# Patient Record
Sex: Female | Born: 1990
Health system: Southern US, Community
[De-identification: ages and names within clinical notes are randomized; demographics above are authoritative.]

## PROBLEM LIST (undated history)

## (undated) DIAGNOSIS — K219 Gastro-esophageal reflux disease without esophagitis: Secondary | ICD-10-CM

## (undated) DIAGNOSIS — F32A Depression, unspecified: Secondary | ICD-10-CM

## (undated) DIAGNOSIS — Z803 Family history of malignant neoplasm of breast: Secondary | ICD-10-CM

## (undated) DIAGNOSIS — F419 Anxiety disorder, unspecified: Secondary | ICD-10-CM

## (undated) DIAGNOSIS — F329 Major depressive disorder, single episode, unspecified: Secondary | ICD-10-CM

## (undated) DIAGNOSIS — Z789 Other specified health status: Secondary | ICD-10-CM

## (undated) HISTORY — DX: Other specified health status: Z78.9

## (undated) HISTORY — DX: Gastro-esophageal reflux disease without esophagitis: K21.9

## (undated) HISTORY — DX: Anxiety disorder, unspecified: F41.9

## (undated) HISTORY — PX: NO PAST SURGERIES: SHX2092

## (undated) HISTORY — DX: Depression, unspecified: F32.A

## (undated) HISTORY — DX: Major depressive disorder, single episode, unspecified: F32.9

## (undated) HISTORY — DX: Family history of malignant neoplasm of breast: Z80.3

---

## 2017-02-05 ENCOUNTER — Telehealth: Payer: Self-pay | Admitting: Obstetrics & Gynecology

## 2017-02-05 NOTE — Telephone Encounter (Signed)
[facility] referring for Pregnancy, Amenorrhea. lvm for pt to call back to be schedule

## 2017-02-08 ENCOUNTER — Encounter: Payer: Self-pay | Admitting: Obstetrics and Gynecology

## 2017-02-08 ENCOUNTER — Ambulatory Visit (INDEPENDENT_AMBULATORY_CARE_PROVIDER_SITE_OTHER): Payer: 59 | Admitting: Obstetrics and Gynecology

## 2017-02-08 VITALS — BP 102/62 | HR 95 | Ht 63.0 in | Wt 187.0 lb

## 2017-02-08 DIAGNOSIS — N912 Amenorrhea, unspecified: Secondary | ICD-10-CM

## 2017-02-08 DIAGNOSIS — Z3481 Encounter for supervision of other normal pregnancy, first trimester: Secondary | ICD-10-CM

## 2017-02-08 DIAGNOSIS — O219 Vomiting of pregnancy, unspecified: Secondary | ICD-10-CM

## 2017-02-08 LAB — POCT URINE PREGNANCY: Preg Test, Ur: POSITIVE — AB

## 2017-02-08 NOTE — Progress Notes (Signed)
02/09/2017   Chief Complaint: Missed period  Transfer of Care Patient: no  History of Present Illness: Ms. Yehuda MaoWillard is a 26 y.o. G2P1001 1929w4d based on Patient's last menstrual period was 01/01/2017 (exact date). with an Estimated Date of Delivery: 10/08/17, with the above CC.   Her periods were: regular periods every 28 days She was using no method when she conceived.  She has Positive signs or symptoms of nausea/vomiting of pregnancy. She has Negative signs or symptoms of miscarriage or preterm labor She identifies Negative Zika risk factors for her and her partner On any different medications around the time she conceived/early pregnancy: Yes, she was on an antidepressant, which she discontinued upon positive pregnancy test.  ROS: A 12-point review of systems was performed and negative, except as stated in the above HPI.  OBGYN History: As per HPI. OB History  Gravida Para Term Preterm AB Living  2 1 1     1   SAB TAB Ectopic Multiple Live Births          1    # Outcome Date GA Lbr Len/2nd Weight Sex Delivery Anes PTL Lv  2 Current           1 Term 11/19/11   8 lb 1 oz (3.657 kg)  Vag-Spont   LIV      Any issues with any prior pregnancies: no Any prior children are healthy, doing well, without any problems or issues: yes History of pap smears: No. Last pap smear: None.  History of STIs.   Past Medical History: Past Medical History:  Diagnosis Date  . No known health problems     Past Surgical History: Past Surgical History:  Procedure Laterality Date  . NO PAST SURGERIES      Family History:  Family History  Problem Relation Age of Onset  . Diabetes Maternal Grandmother   . Cancer Neg Hx    She denies any female cancers, bleeding or blood clotting disorders.  She denies any history of mental retardation, birth defects or genetic disorders in her or the FOB's history  Social History:  Social History   Socioeconomic History  . Marital status: Married   Spouse name: Not on file  . Number of children: Not on file  . Years of education: Not on file  . Highest education level: Not on file  Social Needs  . Financial resource strain: Not on file  . Food insecurity - worry: Not on file  . Food insecurity - inability: Not on file  . Transportation needs - medical: Not on file  . Transportation needs - non-medical: Not on file  Occupational History  . Not on file  Tobacco Use  . Smoking status: Former Games developermoker  . Smokeless tobacco: Never Used  . Tobacco comment: quit when found out pregnant  Substance and Sexual Activity  . Alcohol use: No    Frequency: Never  . Drug use: No  . Sexual activity: Yes    Birth control/protection: None  Other Topics Concern  . Not on file  Social History Narrative  . Not on file    Allergy: No Known Allergies  Current Outpatient Medications:  Current Outpatient Medications:  .  Prenatal Vit-Fe Fumarate-FA (MULTIVITAMIN-PRENATAL) 27-0.8 MG TABS tablet, Take 1 tablet daily at 12 noon by mouth., Disp: , Rfl:  .  Doxylamine-Pyridoxine ER (BONJESTA) 20-20 MG TBCR, Take 1 tablet 2 (two) times daily by mouth. Sig 1 tab po  daily at bedtime, 1 tab po daily morning,  Disp: 60 tablet, Rfl: 2 .  Prenatal Multivit-Min-Fe-FA (PRENATAL VITAMINS) 0.8 MG tablet, Take 1 tablet daily by mouth., Disp: 30 tablet, Rfl: 12   Physical Exam:   BP 102/62   Pulse 95   Ht 5\' 3"  (1.6 m)   Wt 187 lb (84.8 kg)   LMP 01/01/2017 (Exact Date)   BMI 33.13 kg/m  Body mass index is 33.13 kg/m. Constitutional: Well nourished, well developed female in no acute distress.  Neck:  Supple, normal appearance, and no thyromegaly  Cardiovascular: S1, S2 normal, no murmur, rub or gallop, regular rate and rhythm Respiratory:  Clear to auscultation bilateral. Normal respiratory effort Abdomen: positive bowel sounds and no masses, hernias; diffusely non tender to palpation, non distended Breasts: breasts appear normal, no suspicious masses,  no skin or nipple changes or axillary nodes. Neuro/Psych:  Normal mood and affect.  Skin:  Warm and dry.  Lymphatic:  No inguinal lymphadenopathy.   Pelvic exam: is not limited by body habitus EGBUS: within normal limits, Vagina: within normal limits and with no blood in the vault, Cervix: normal appearing cervix without discharge or lesions, closed/long/high, Uterus:  nonenlarged, and Adnexa:  normal adnexa and no mass, fullness, tenderness  Assessment: Ms. Yehuda MaoWillard is a 26 y.o. G2P1001 6459w4d based on Patient's last menstrual period was 01/01/2017 (exact date). with an Estimated Date of Delivery: 10/08/17,  for prenatal care.  Plan:  1) Avoid alcoholic beverages. 2) Patient encouraged not to smoke.  3) Discontinue the use of all non-medicinal drugs and chemicals.  4) Take prenatal vitamins daily.  5) Seatbelt use advised  6) Nutrition, food safety (fish, cheese advisories, and high nitrite foods) and exercise discussed. 7) Hospital and practice style delivering at [facility] discussed  8) Patient is asked about travel to areas at risk for the Zika virus, and counseled to avoid travel and exposure to mosquitoes or sexual partners who may have themselves been exposed to the virus. Testing is discussed, and will be ordered as appropriate.  9) [facility]birth classes at [facility] advised 10) Genetic Screening, such as with 1st Trimester Screening, cell free fetal DNA, AFP testing, and Ultrasound, as well as with amniocentesis and CVS as appropriate, is discussed with patient. She plans to She is considering if she wants genetic testing.  genetic testing this pregnancy. 11) Rx sent for prenatal vitamins and bonjesta  Problem list reviewed and updated.  Noreene Larssonhristanna Schuma, MD [facility], [facility] 02/09/2017  10:48 PM

## 2017-02-09 ENCOUNTER — Encounter: Payer: Self-pay | Admitting: Obstetrics and Gynecology

## 2017-02-09 DIAGNOSIS — O219 Vomiting of pregnancy, unspecified: Secondary | ICD-10-CM | POA: Insufficient documentation

## 2017-02-09 DIAGNOSIS — Z3481 Encounter for supervision of other normal pregnancy, first trimester: Secondary | ICD-10-CM | POA: Insufficient documentation

## 2017-02-09 LAB — RPR+RH+ABO+RUB AB+AB SCR+CB...
Antibody Screen: NEGATIVE
HEMATOCRIT: 43.5 % (ref 34.0–46.6)
HEMOGLOBIN: 14.3 g/dL (ref 11.1–15.9)
HEP B S AG: NEGATIVE
HIV Screen 4th Generation wRfx: NONREACTIVE
MCH: 29 pg (ref 26.6–33.0)
MCHC: 32.9 g/dL (ref 31.5–35.7)
MCV: 88 fL (ref 79–97)
Platelets: 291 10*3/uL (ref 150–379)
RBC: 4.93 x10E6/uL (ref 3.77–5.28)
RDW: 13.4 % (ref 12.3–15.4)
RH TYPE: POSITIVE
RPR Ser Ql: NONREACTIVE
Rubella Antibodies, IGG: 2.04 index (ref >0.99)
Varicella zoster IgG: 419 index (ref >165)
WBC: 7.8 10*3/uL (ref 3.4–10.8)

## 2017-02-09 MED ORDER — DOXYLAMINE-PYRIDOXINE ER 20-20 MG PO TBCR: 1.0000 | EXTENDED_RELEASE_TABLET | Freq: Two times a day (BID) | ORAL | 2 refills | Status: DC

## 2017-02-09 MED ORDER — PRENATAL VITAMINS 0.8 MG PO TABS: 1.0000 | ORAL_TABLET | Freq: Every day | ORAL | 12 refills | Status: DC

## 2017-02-12 ENCOUNTER — Telehealth: Payer: Self-pay

## 2017-02-12 ENCOUNTER — Other Ambulatory Visit: Payer: Self-pay | Admitting: Obstetrics and Gynecology

## 2017-02-12 ENCOUNTER — Encounter: Payer: Self-pay | Admitting: Obstetrics and Gynecology

## 2017-02-12 DIAGNOSIS — O219 Vomiting of pregnancy, unspecified: Secondary | ICD-10-CM

## 2017-02-12 LAB — PAP LB, CT-NG, RFX HPV ASCU
Chlamydia, Nuc. Acid Amp: NEGATIVE
Gonococcus, Nuc. Acid Amp: NEGATIVE
PAP SMEAR COMMENT: 0

## 2017-02-12 MED ORDER — DOXYLAMINE SUCCINATE (SLEEP) 25 MG PO TABS: 25.0000 mg | ORAL_TABLET | Freq: Four times a day (QID) | ORAL | 2 refills | Status: DC | PRN

## 2017-02-12 MED ORDER — PYRIDOXINE HCL 25 MG PO TABS
25.0000 mg | ORAL_TABLET | Freq: Four times a day (QID) | ORAL | 3 refills | Status: DC | PRN
Start: 2017-02-12 — End: 2017-04-07

## 2017-02-12 NOTE — Progress Notes (Signed)
I was mistaken. I just sent the patient a rx for B12 and unisom.

## 2017-02-12 NOTE — Telephone Encounter (Signed)
I was confused, but I spoke with this patient today on the phone and sent a rx for b12 and unisom to her pharmacy.

## 2017-02-12 NOTE — Telephone Encounter (Signed)
Okay just wanted to make sure I didn't need to do anything else. Thank you for letting me know!

## 2017-02-12 NOTE — Telephone Encounter (Signed)
Pt's pharmacy sent second fax request for prior authorization or medication change of bonjesta. Please send rx for diclegis to see if it is covered before I begin process of prior auth for bonjesta. Thank you!

## 2017-02-12 NOTE — Telephone Encounter (Signed)
Discussed with the patient yesterday and she does not desire  a prescription for bonjesta or diclegis. Thank you!

## 2017-02-15 LAB — URINE CULTURE

## 2017-02-15 LAB — TOXASSURE SELECT 13 (MW), URINE

## 2017-02-17 ENCOUNTER — Telehealth: Payer: 59 | Admitting: Family

## 2017-02-17 DIAGNOSIS — O219 Vomiting of pregnancy, unspecified: Secondary | ICD-10-CM

## 2017-02-17 NOTE — Progress Notes (Signed)
Based on what you shared with me it looks like you have a serious condition that should be evaluated in a face to face office visit.  NOTE: Even if you have entered your credit card information for this eVisit, you will not be charged.   You need to follow up with your GYN. Please try to stay hydrated and drink lots of fluids.   If you are having a true medical emergency please call 911.  If you need an urgent face to face visit,  has four urgent care centers for your convenience.  If you need care fast and have a high deductible or no insurance consider:   [email or web address]  [phone]  [address], [address] [place], [place] [address] 8 am to 8 pm Monday-Friday 10 am to 4 pm Saturday-Sunday   The following sites will take your  insurance:    . [facility]  [phone] Get Driving Directions Find a Provider at this Location  [address] [place], [place] [address] . 10 am to 8 pm Monday-Friday . 12 pm to 8 pm Saturday-Sunday   . [facility]  [phone] Get Driving Directions Find a Provider at this Location  [address] [place], [place] [address] . 8 am to 8 pm Monday-Friday . 9 am to 6 pm Saturday . 11 am to 6 pm Sunday   . [facility] at [facility] [place]  [phone] Get Driving Directions  [address] [place], [place] [address] . 8 am to 8 pm Monday-Friday . 8 am to 4 pm Saturday-Sunday   Your e-visit answers were reviewed by a board certified advanced clinical practitioner to complete your personal care plan.  Thank you for using e-Visits.

## 2017-02-19 ENCOUNTER — Telehealth: Payer: Self-pay

## 2017-02-19 ENCOUNTER — Other Ambulatory Visit: Payer: Self-pay | Admitting: Obstetrics and Gynecology

## 2017-02-19 MED ORDER — ONDANSETRON 4 MG PO TBDP: 4.0000 mg | ORAL_TABLET | Freq: Four times a day (QID) | ORAL | 0 refills | Status: DC | PRN

## 2017-02-19 MED ORDER — DOCUSATE SODIUM 100 MG PO CAPS: 100.0000 mg | ORAL_CAPSULE | Freq: Two times a day (BID) | ORAL | 2 refills | Status: DC | PRN

## 2017-02-19 NOTE — Progress Notes (Unsigned)
zofran

## 2017-02-19 NOTE — Telephone Encounter (Signed)
I returned the patients call. Sent in rx for zofran and colace.

## 2017-02-19 NOTE — Telephone Encounter (Signed)
Pt given promethazine rectal suppositories but they cause drowsiness and she does not want to take it during the day. Pt has tried vitamin B6 and unisom but unable to keep it down. Pt unable to keep fluids, meds or food down over the weekend and feeling bad again today. Please advise. Thank you.

## 2017-02-19 NOTE — Telephone Encounter (Signed)
Pt was seen in ER on Sunday for dehydration. She hadn't been able to keep anything down for 3 days prior. She was given fluids & nausea meds which helped, but n/v has returned today. She is trying to avoid having to return to the ER & is inquiring if there is anything else she can take for nausea as she is unable to keep the pills down. IO#[phone]#[phone].

## 2017-02-22 ENCOUNTER — Ambulatory Visit (INDEPENDENT_AMBULATORY_CARE_PROVIDER_SITE_OTHER): Payer: 59

## 2017-02-22 ENCOUNTER — Ambulatory Visit (INDEPENDENT_AMBULATORY_CARE_PROVIDER_SITE_OTHER): Payer: 59 | Admitting: Obstetrics & Gynecology

## 2017-02-22 VITALS — BP 100/70 | Wt 183.0 lb

## 2017-02-22 DIAGNOSIS — Z3481 Encounter for supervision of other normal pregnancy, first trimester: Secondary | ICD-10-CM

## 2017-02-22 DIAGNOSIS — Z363 Encounter for antenatal screening for malformations: Secondary | ICD-10-CM | POA: Diagnosis not present

## 2017-02-22 DIAGNOSIS — O219 Vomiting of pregnancy, unspecified: Secondary | ICD-10-CM

## 2017-02-22 DIAGNOSIS — Z3A01 Less than 8 weeks gestation of pregnancy: Secondary | ICD-10-CM

## 2017-02-22 MED ORDER — PROMETHAZINE HCL 25 MG RE SUPP: 25.0000 mg | Freq: Four times a day (QID) | RECTAL | 0 refills | Status: DC | PRN

## 2017-02-22 MED ORDER — METOCLOPRAMIDE HCL 10 MG PO TABS
10.0000 mg | ORAL_TABLET | Freq: Three times a day (TID) | ORAL | 3 refills | Status: DC
Start: 2017-02-22 — End: 2017-06-14

## 2017-02-22 NOTE — Progress Notes (Signed)
Review of ULTRASOUND.    I have personally reviewed images and report of [facility].    Plan of management to be discussed with patient.   Nausea discussed.  Reglan and B6 TID, Phenergan supp when severe.    Has had 2 hospital visits for IVF.  Will cont to monitor closely.  Monitor for vag bleeding or pain.  Declines genetic testing  Annamarie MajorPaul Tamar Lipscomb, MD, Merlinda FrederickFACOG [facility], [facility] 02/22/2017  5:13 PM

## 2017-02-23 DIAGNOSIS — Z803 Family history of malignant neoplasm of breast: Secondary | ICD-10-CM

## 2017-02-23 HISTORY — DX: Family history of malignant neoplasm of breast: Z80.3

## 2017-03-01 ENCOUNTER — Other Ambulatory Visit: Payer: Self-pay | Admitting: Obstetrics and Gynecology

## 2017-03-12 ENCOUNTER — Encounter: Payer: Self-pay | Admitting: Obstetrics and Gynecology

## 2017-03-24 ENCOUNTER — Other Ambulatory Visit: Payer: Self-pay | Admitting: Obstetrics & Gynecology

## 2017-03-25 ENCOUNTER — Encounter: Payer: 59 | Admitting: Obstetrics and Gynecology

## 2017-04-01 ENCOUNTER — Ambulatory Visit (INDEPENDENT_AMBULATORY_CARE_PROVIDER_SITE_OTHER): Payer: 59 | Admitting: Maternal Newborn

## 2017-04-01 ENCOUNTER — Encounter: Payer: Self-pay | Admitting: Maternal Newborn

## 2017-04-01 VITALS — BP 102/62 | Wt 178.0 lb

## 2017-04-01 DIAGNOSIS — Z3481 Encounter for supervision of other normal pregnancy, first trimester: Secondary | ICD-10-CM

## 2017-04-01 NOTE — Progress Notes (Signed)
n

## 2017-04-01 NOTE — Progress Notes (Signed)
Routine Prenatal Care Visit  Subjective  Rhonda Sweeney is a 27 y.o. G2P1001 at [redacted]w[redacted]d being seen today for ongoing prenatal care.  She is currently monitored for the following issues for this low-risk pregnancy and has Supervision of normal intrauterine pregnancy in multigravida in first trimester and Nausea and vomiting during pregnancy on their problem list.  ----------------------------------------------------------------------------------- Patient reports nausea and vomiting.  It occurs about every other day and is worse in the afternoon and evening. She is able to keep food and liquids down in the mornings. It has improved in the last couple of weeks. She also has some lower uterine pains occasionally when changing positions. Contractions: Not present. Vag. Bleeding: None.  Denies leaking of fluid.  ----------------------------------------------------------------------------------- The following portions of the patient's history were reviewed and updated as appropriate: allergies, current medications, past family history, past medical history, past social history, past surgical history and problem list. Problem list updated.   Objective  Last menstrual period 01/01/2017. Pregravid weight 187 lb (84.8 kg) Total Weight Gain  (-4.082 kg) Urinalysis: Urine Protein: Negative Urine Glucose: Negative  Fetal Status: Fetal Heart Rate (bpm): 144         General:  Alert, oriented and cooperative. Patient is in no acute distress.  Skin: Skin is warm and dry. No rash noted.   Cardiovascular: Normal heart rate noted  Respiratory: Normal respiratory effort, no problems with respiration noted  Abdomen: Soft, gravid, appropriate for gestational age.       Pelvic:  Cervical exam deferred        Extremities: Normal range of motion.  Edema: None  Mental Status: Normal mood and affect. Normal behavior. Normal judgment and thought content.     Assessment   26 y.o. G2P1001 at [redacted]w[redacted]d, EDD 10/08/2017  by Last Menstrual Period presenting for routine prenatal visit.  Plan   second pregnancy Problems (from 02/08/17 to present)    Problem Noted Resolved   Supervision of normal intrauterine pregnancy in multigravida in first trimester 02/09/2017 by Natale Milch, MD No   Overview Signed 02/09/2017 10:56 PM by Natale Milch, MD      Clinic [facility] Prenatal Labs  Dating  Blood type: B/Positive/-- (11/16 1020)   Genetic Screen 1 Screen:     AFP:      Quad:      NIPS:    Antibody:Negative (11/16 1020)  Anatomic [place]  Rubella: 2.04 (11/16 1020) Varicella: @VZVIGG @  GTT Early:        28 wk:      RPR: Non Reactive (11/16 1020)   Rhogam  HBsAg: Negative (11/16 1020)   TDaP vaccine                       HIV:     Flu Shot                                GBS:   Contraception  Pap:  CBB     CS/VBAC    Baby Food    Support Person             Nausea and vomiting during pregnancy 02/09/2017 by Natale Milch, MD No      Preterm labor symptoms and general obstetric precautions including but not limited to vaginal bleeding, contractions, leaking of fluid and fetal movement were reviewed in detail with the patient. Please refer to After Visit Summary for  other counseling recommendations.   Return in about 4 weeks (around 04/29/2017) for ROB.  Marcelyn BruinsJacelyn Schmid, CNM 04/01/2017  8:40 AM

## 2017-04-01 NOTE — Progress Notes (Signed)
Pt c/o nausea/ vomiting

## 2017-04-01 NOTE — Patient Instructions (Signed)

## 2017-04-07 ENCOUNTER — Other Ambulatory Visit: Payer: Self-pay | Admitting: Obstetrics and Gynecology

## 2017-04-07 DIAGNOSIS — O219 Vomiting of pregnancy, unspecified: Secondary | ICD-10-CM

## 2017-04-08 ENCOUNTER — Encounter: Payer: Self-pay | Admitting: Obstetrics and Gynecology

## 2017-04-08 ENCOUNTER — Other Ambulatory Visit: Payer: Self-pay | Admitting: Obstetrics and Gynecology

## 2017-04-08 DIAGNOSIS — O219 Vomiting of pregnancy, unspecified: Secondary | ICD-10-CM

## 2017-04-08 MED ORDER — ONDANSETRON 4 MG PO TBDP: 4.0000 mg | ORAL_TABLET | Freq: Four times a day (QID) | ORAL | 4 refills | Status: DC | PRN

## 2017-04-08 NOTE — Telephone Encounter (Signed)
Called and discussed with patient. Sent prescription for zofran. Recommended applesauce, popsicles, Gatorade, rice, toast, bananas.

## 2017-04-11 ENCOUNTER — Other Ambulatory Visit: Payer: Self-pay | Admitting: Obstetrics & Gynecology

## 2017-04-26 NOTE — Telephone Encounter (Signed)
Please advise for refills. Thank you! 

## 2017-04-29 ENCOUNTER — Encounter: Payer: 59 | Admitting: Obstetrics and Gynecology

## 2017-05-03 ENCOUNTER — Ambulatory Visit (INDEPENDENT_AMBULATORY_CARE_PROVIDER_SITE_OTHER): Payer: 59 | Admitting: Obstetrics & Gynecology

## 2017-05-03 VITALS — BP 100/60 | Wt 178.0 lb

## 2017-05-03 DIAGNOSIS — Z3481 Encounter for supervision of other normal pregnancy, first trimester: Secondary | ICD-10-CM

## 2017-05-03 DIAGNOSIS — Z3A17 17 weeks gestation of pregnancy: Secondary | ICD-10-CM

## 2017-05-03 NOTE — Progress Notes (Signed)
  Subjective  Fetal Movement? no Contractions? no Leaking Fluid? no Vaginal Bleeding? no  Objective  BP 100/60   Wt 178 lb (80.7 kg)   LMP 01/01/2017 (Exact Date)   BMI 31.53 kg/m  General: NAD Pumonary: no increased work of breathing Abdomen: gravid, non-tender Extremities: no edema Psychiatric: mood appropriate, affect full  Assessment  26 y.o. G2P1001 at 6144w3d by  10/08/2017, by Last Menstrual Period presenting for routine prenatal visit  Plan   Problem List Items Addressed This Visit      Other   Supervision of normal intrauterine pregnancy in multigravida in first trimester   Relevant Orders   US OB Comp + 14 Wk    Other Visit Diagnoses    [redacted] weeks gestation of pregnancy    -  Primary   Relevant Orders   US OB Comp + 14 Wk    PNV  Annamarie MajorPaul Shantee Hayne, MD, FACOG [facility], [facility] 05/03/2017  4:59 PM

## 2017-05-03 NOTE — Patient Instructions (Signed)

## 2017-05-14 ENCOUNTER — Telehealth: Payer: Self-pay

## 2017-05-14 NOTE — Telephone Encounter (Signed)
Pt reports she is [redacted] wks pregnant & starting this a.m. She has been feeling some pressure in her chest causing some SOB. She states when touching the area where pressure is it hurts. Pt inquiring if normal. Cb#[phone]

## 2017-05-14 NOTE — Telephone Encounter (Signed)
Spoke w/pt. Pt states her pain & SOB has decreased w/Tylenol. Chest still feels a little sore to touch. Advised can try Tums/Zantac to r/o Indigestion which can cause chest pain. Pt denies any calf pain etc that would relate to DVT. Pt will try Tums & notify us if s&s worsen or do not improve and she desires apt for eval.

## 2017-05-16 ENCOUNTER — Other Ambulatory Visit: Payer: Self-pay | Admitting: Obstetrics and Gynecology

## 2017-05-16 ENCOUNTER — Other Ambulatory Visit: Payer: Self-pay | Admitting: Obstetrics & Gynecology

## 2017-05-16 DIAGNOSIS — O219 Vomiting of pregnancy, unspecified: Secondary | ICD-10-CM

## 2017-05-16 NOTE — Telephone Encounter (Signed)
Pt has had 5 dispenses of this medication in the past 2 months. Please advise for refill. Thank you.

## 2017-05-16 NOTE — Telephone Encounter (Signed)
Please advise 

## 2017-05-17 ENCOUNTER — Ambulatory Visit (INDEPENDENT_AMBULATORY_CARE_PROVIDER_SITE_OTHER): Payer: BLUE CROSS/BLUE SHIELD | Admitting: Maternal Newborn

## 2017-05-17 ENCOUNTER — Encounter: Payer: Self-pay | Admitting: Maternal Newborn

## 2017-05-17 ENCOUNTER — Ambulatory Visit (INDEPENDENT_AMBULATORY_CARE_PROVIDER_SITE_OTHER): Payer: BLUE CROSS/BLUE SHIELD

## 2017-05-17 VITALS — BP 100/60 | Wt 176.0 lb

## 2017-05-17 DIAGNOSIS — Z3481 Encounter for supervision of other normal pregnancy, first trimester: Secondary | ICD-10-CM

## 2017-05-17 DIAGNOSIS — Z3689 Encounter for other specified antenatal screening: Secondary | ICD-10-CM

## 2017-05-17 DIAGNOSIS — Z3A17 17 weeks gestation of pregnancy: Secondary | ICD-10-CM | POA: Diagnosis not present

## 2017-05-17 DIAGNOSIS — Z3A19 19 weeks gestation of pregnancy: Secondary | ICD-10-CM

## 2017-05-17 NOTE — Progress Notes (Signed)
    Routine Prenatal Care Visit  Subjective  Rhonda Sweeney is a 27 y.o. G2P1001 at 4464w3d being seen today for ongoing prenatal care.  She is currently monitored for the following issues for this low-risk pregnancy and has Supervision of normal intrauterine pregnancy in multigravida in first trimester and Nausea and vomiting during pregnancy on their problem list.  ----------------------------------------------------------------------------------- Patient reports nausea, vomiting, heartburn. Contractions: Not present. Vag. Bleeding: None.  Movement: Present. Denies leaking of fluid.  ----------------------------------------------------------------------------------- The following portions of the patient's history were reviewed and updated as appropriate: allergies, current medications, past family history, past medical history, past social history, past surgical history and problem list. Problem list updated.   Objective  Last menstrual period 01/01/2017. Pregravid weight 187 lb (84.8 kg) Total Weight Gain  (-4.99 kg) Urinalysis: Urine Protein: Negative Urine Glucose: Negative  Fetal Status: Fetal Heart Rate (bpm): 147   Movement: Present     General:  Alert, oriented and cooperative. Patient is in no acute distress.  Skin: Skin is warm and dry. No rash noted.   Cardiovascular: Normal heart rate noted  Respiratory: Normal respiratory effort, no problems with respiration noted  Abdomen: Soft, gravid, appropriate for gestational age. Pain/Pressure: Absent     Pelvic:  Cervical exam deferred        Extremities: Normal range of motion.     Mental Status: Normal mood and affect. Normal behavior. Normal judgment and thought content.     Assessment   26 y.o. G2P1001 at 464w3d, EDD 10/08/2017 by Last Menstrual Period presenting for routine prenatal visit.  Plan   second pregnancy Problems (from 02/08/17 to present)    Problem Noted Resolved   Supervision of normal intrauterine pregnancy  in multigravida in first trimester 02/09/2017 by Natale MilchSchuman, Christanna R, MD No   Overview Signed 02/09/2017 10:56 PM by Natale MilchSchuman, Christanna R, MD      Clinic [facility] Prenatal Labs  Dating  Blood type: B/Positive/-- (11/16 1020)   Genetic Screen 1 Screen:     AFP:      Quad:      NIPS:    Antibody:Negative (11/16 1020)  Anatomic US  Rubella: 2.04 (11/16 1020) Varicella: @VZVIGG @  GTT Early:        28 wk:      RPR: Non Reactive (11/16 1020)   Rhogam  HBsAg: Negative (11/16 1020)   TDaP vaccine                       HIV:     Flu Shot                                GBS:   Contraception  Pap:  CBB     CS/VBAC    Baby Food    Support Person             Nausea and vomiting during pregnancy 02/09/2017 by Natale MilchSchuman, Christanna R, MD No    Anatomy scan incomplete for spine and face, otherwise normal. Follow up in 4 weeks.  Continue antiemetics and medication for heartburn and advised dietary measures for nausea control.  Preterm labor symptoms and general obstetric precautions were reviewed with the patient.  Return in about 4 weeks (around 06/14/2017) for ROB and anatomy f/u ultrasound.  Marcelyn BruinsJacelyn Schmid, CNM 05/23/2017  12:15 PM

## 2017-05-17 NOTE — Progress Notes (Signed)
C/o been really sick.rj

## 2017-05-24 ENCOUNTER — Encounter: Payer: Self-pay | Admitting: Obstetrics and Gynecology

## 2017-05-29 ENCOUNTER — Other Ambulatory Visit: Payer: Self-pay | Admitting: Obstetrics & Gynecology

## 2017-05-29 NOTE — Telephone Encounter (Signed)
Please advise 

## 2017-06-14 ENCOUNTER — Ambulatory Visit (INDEPENDENT_AMBULATORY_CARE_PROVIDER_SITE_OTHER): Payer: BLUE CROSS/BLUE SHIELD | Admitting: Obstetrics and Gynecology

## 2017-06-14 ENCOUNTER — Ambulatory Visit (INDEPENDENT_AMBULATORY_CARE_PROVIDER_SITE_OTHER): Payer: BLUE CROSS/BLUE SHIELD

## 2017-06-14 VITALS — BP 120/70 | Wt 186.0 lb

## 2017-06-14 DIAGNOSIS — Z3A24 24 weeks gestation of pregnancy: Secondary | ICD-10-CM | POA: Diagnosis not present

## 2017-06-14 DIAGNOSIS — Z3689 Encounter for other specified antenatal screening: Secondary | ICD-10-CM

## 2017-06-14 DIAGNOSIS — Z113 Encounter for screening for infections with a predominantly sexual mode of transmission: Secondary | ICD-10-CM

## 2017-06-14 DIAGNOSIS — Z3A23 23 weeks gestation of pregnancy: Secondary | ICD-10-CM

## 2017-06-14 DIAGNOSIS — Z3481 Encounter for supervision of other normal pregnancy, first trimester: Secondary | ICD-10-CM

## 2017-06-14 DIAGNOSIS — Z131 Encounter for screening for diabetes mellitus: Secondary | ICD-10-CM

## 2017-06-14 NOTE — Progress Notes (Signed)
ROB Anatomy scan follow up today 

## 2017-06-14 NOTE — Progress Notes (Signed)
Routine Prenatal Care Visit  Subjective  Rhonda Sweeney is a 27 y.o. G2P1001 at [redacted]w[redacted]d being seen today for ongoing prenatal care.  She is currently monitored for the following issues for this low-risk pregnancy and has Supervision of normal intrauterine pregnancy in multigravida in first trimester and Nausea and vomiting during pregnancy on their problem list.  ----------------------------------------------------------------------------------- Patient reports no complaints.   Contractions: Not present. Vag. Bleeding: None.  Movement: Present. Denies leaking of fluid.  ----------------------------------------------------------------------------------- The following portions of the patient's history were reviewed and updated as appropriate: allergies, current medications, past family history, past medical history, past social history, past surgical history and problem list. Problem list updated.   Objective  Blood pressure 120/70, weight 186 lb (84.4 kg), last menstrual period 01/01/2017. Pregravid weight 187 lb (84.8 kg) Total Weight Gain -1 lb (-0.454 kg) Urinalysis:      Fetal Status: Fetal Heart Rate (bpm): 145   Movement: Present     General:  Alert, oriented and cooperative. Patient is in no acute distress.  Skin: Skin is warm and dry. No rash noted.   Cardiovascular: Normal heart rate noted  Respiratory: Normal respiratory effort, no problems with respiration noted  Abdomen: Soft, gravid, appropriate for gestational age. Pain/Pressure: Absent     Pelvic:  Cervical exam deferred        Extremities: Normal range of motion.     ental Status: Normal mood and affect. Normal behavior. Normal judgment and thought content.   US Ob Follow Up  Result Date: 06/14/2017 ULTRASOUND REPORT Location: [facility] Date of Service: 06/14/2017 Indications:F/U Anatomy Findings: Mason Jim intrauterine pregnancy is visualized with FHR at 148 BPM. Biometrics give an (U/S) Gestational age of [redacted]w[redacted]d  and an (U/S) EDD of 10/08/2017; this correlates with the clinically established EDD of Estimated Date of Delivery: 10/08/17. Fetal presentation is Transverse. Placenta: Anterior, Grade 0. AFI: subjectively normal. Anatomic survey is complete. Survey of the adnexa demonstrates no adnexal masses. There is no free peritoneal fluid in the cul de sac. Impression: 1. [redacted]w[redacted]d Viable Singleton Intrauterine pregnancy previously established criteria. 2. Normal Anatomy Scan is now complete Recommendations: 1.Clinical correlation with the patient's History and Physical Exam. Mital bahen P Patel, RDMS There is a singleton gestation with subjectively normal amniotic fluid volume.  Limited evaluation of the fetal anatomy was performed today, focusing on on anatomic structures not fully visualized at the time of prior study.The visualized fetal anatomical survey appears within normal limits within the resolution of ultrasound as described above, and the anatomic survey is now complete.  It must be noted that a normal ultrasound is unable to rule out fetal aneuploidy.  Vena Austria, MD, Evern Core [facility], [facility] 06/14/2017, 4:25 PM     Assessment   26 y.o. G2P1001 at [redacted]w[redacted]d by  10/08/2017, by Last Menstrual Period presenting for routine prenatal visit  Plan   second pregnancy Problems (from 02/08/17 to present)    Problem Noted Resolved   Supervision of normal intrauterine pregnancy in multigravida in first trimester 02/09/2017 by Natale Milch, MD No   Overview Addendum 05/23/2017 12:16 PM by Oswaldo Conroy, CNM      Clinic [facility] Prenatal Labs  Dating  Blood type: B/Positive/-- (11/16 1020)   Genetic Screen Declined Antibody:Negative (11/16 1020)  Anatomic [place]  Rubella: 2.04 (11/16 1020) Varicella: Immune  GTT Early:        28 wk:      RPR: Non Reactive (11/16 1020)   Rhogam  HBsAg: Negative (11/16 1020)   TDaP vaccine                       HIV: Non Reactive (11/16 1020)     Flu Shot                                GBS:   Contraception  Pap:  CBB     CS/VBAC    Baby Food    Support Person             Nausea and vomiting during pregnancy 02/09/2017 by Natale MilchSchuman, Christanna R, MD No       Gestational age appropriate obstetric precautions including but not limited to vaginal bleeding, contractions, leaking of fluid and fetal movement were reviewed in detail with the patient.    Return in about 1 month (around 07/12/2017) for ROB and 28 week labs.  Vena AustriaAndreas Deaven Barron, MD, Evern CoreFACOG [facility], [facility] 06/16/2017, 10:15 PM     Fusion plus samples given to take with prenatal gummies or providia ob (also sampled)  Anatomy scan complete  28 week labs next visit

## 2017-06-28 DIAGNOSIS — Z3A25 25 weeks gestation of pregnancy: Secondary | ICD-10-CM | POA: Diagnosis not present

## 2017-06-28 DIAGNOSIS — O4702 False labor before 37 completed weeks of gestation, second trimester: Secondary | ICD-10-CM | POA: Diagnosis not present

## 2017-06-28 DIAGNOSIS — Z87891 Personal history of nicotine dependence: Secondary | ICD-10-CM | POA: Diagnosis not present

## 2017-07-11 ENCOUNTER — Encounter: Payer: BLUE CROSS/BLUE SHIELD | Admitting: Advanced Practice Midwife

## 2017-07-11 ENCOUNTER — Other Ambulatory Visit: Payer: BLUE CROSS/BLUE SHIELD

## 2017-08-05 DIAGNOSIS — Z3A30 30 weeks gestation of pregnancy: Secondary | ICD-10-CM | POA: Diagnosis not present

## 2017-08-05 DIAGNOSIS — O99213 Obesity complicating pregnancy, third trimester: Secondary | ICD-10-CM | POA: Diagnosis not present

## 2017-08-08 DIAGNOSIS — R7302 Impaired glucose tolerance (oral): Secondary | ICD-10-CM | POA: Diagnosis not present

## 2017-08-22 DIAGNOSIS — F419 Anxiety disorder, unspecified: Secondary | ICD-10-CM | POA: Diagnosis not present

## 2017-08-22 DIAGNOSIS — O9989 Other specified diseases and conditions complicating pregnancy, childbirth and the puerperium: Secondary | ICD-10-CM | POA: Diagnosis not present

## 2017-08-22 DIAGNOSIS — M545 Low back pain: Secondary | ICD-10-CM | POA: Diagnosis not present

## 2017-08-22 DIAGNOSIS — O99213 Obesity complicating pregnancy, third trimester: Secondary | ICD-10-CM | POA: Diagnosis not present

## 2017-08-22 DIAGNOSIS — N760 Acute vaginitis: Secondary | ICD-10-CM | POA: Diagnosis not present

## 2017-08-22 DIAGNOSIS — Z041 Encounter for examination and observation following transport accident: Secondary | ICD-10-CM | POA: Diagnosis not present

## 2017-08-22 DIAGNOSIS — Z3A33 33 weeks gestation of pregnancy: Secondary | ICD-10-CM | POA: Diagnosis not present

## 2017-08-22 DIAGNOSIS — O23593 Infection of other part of genital tract in pregnancy, third trimester: Secondary | ICD-10-CM | POA: Diagnosis not present

## 2017-08-22 DIAGNOSIS — O99343 Other mental disorders complicating pregnancy, third trimester: Secondary | ICD-10-CM | POA: Diagnosis not present

## 2017-08-22 DIAGNOSIS — Z87891 Personal history of nicotine dependence: Secondary | ICD-10-CM | POA: Diagnosis not present

## 2017-08-22 DIAGNOSIS — R198 Other specified symptoms and signs involving the digestive system and abdomen: Secondary | ICD-10-CM | POA: Diagnosis not present

## 2017-08-22 DIAGNOSIS — E669 Obesity, unspecified: Secondary | ICD-10-CM | POA: Diagnosis not present

## 2017-08-22 DIAGNOSIS — O9A213 Injury, poisoning and certain other consequences of external causes complicating pregnancy, third trimester: Secondary | ICD-10-CM | POA: Diagnosis not present

## 2017-08-22 DIAGNOSIS — O99810 Abnormal glucose complicating pregnancy: Secondary | ICD-10-CM | POA: Diagnosis not present

## 2017-08-23 DIAGNOSIS — O9A213 Injury, poisoning and certain other consequences of external causes complicating pregnancy, third trimester: Secondary | ICD-10-CM | POA: Diagnosis not present

## 2017-08-23 DIAGNOSIS — O99343 Other mental disorders complicating pregnancy, third trimester: Secondary | ICD-10-CM | POA: Diagnosis not present

## 2017-08-23 DIAGNOSIS — O9989 Other specified diseases and conditions complicating pregnancy, childbirth and the puerperium: Secondary | ICD-10-CM | POA: Diagnosis not present

## 2017-08-23 DIAGNOSIS — N760 Acute vaginitis: Secondary | ICD-10-CM | POA: Diagnosis not present

## 2017-08-23 DIAGNOSIS — R198 Other specified symptoms and signs involving the digestive system and abdomen: Secondary | ICD-10-CM | POA: Diagnosis not present

## 2017-08-23 DIAGNOSIS — Z041 Encounter for examination and observation following transport accident: Secondary | ICD-10-CM | POA: Diagnosis not present

## 2017-08-23 DIAGNOSIS — E669 Obesity, unspecified: Secondary | ICD-10-CM | POA: Diagnosis not present

## 2017-08-23 DIAGNOSIS — O99213 Obesity complicating pregnancy, third trimester: Secondary | ICD-10-CM | POA: Diagnosis not present

## 2017-08-23 DIAGNOSIS — Z3A33 33 weeks gestation of pregnancy: Secondary | ICD-10-CM | POA: Diagnosis not present

## 2017-08-23 DIAGNOSIS — M545 Low back pain: Secondary | ICD-10-CM | POA: Diagnosis not present

## 2017-08-23 DIAGNOSIS — O23593 Infection of other part of genital tract in pregnancy, third trimester: Secondary | ICD-10-CM | POA: Diagnosis not present

## 2017-08-23 DIAGNOSIS — Z87891 Personal history of nicotine dependence: Secondary | ICD-10-CM | POA: Diagnosis not present

## 2017-08-23 DIAGNOSIS — F419 Anxiety disorder, unspecified: Secondary | ICD-10-CM | POA: Diagnosis not present

## 2017-09-02 DIAGNOSIS — Z3482 Encounter for supervision of other normal pregnancy, second trimester: Secondary | ICD-10-CM | POA: Diagnosis not present

## 2017-09-17 DIAGNOSIS — R12 Heartburn: Secondary | ICD-10-CM | POA: Diagnosis not present

## 2017-09-17 DIAGNOSIS — O9989 Other specified diseases and conditions complicating pregnancy, childbirth and the puerperium: Secondary | ICD-10-CM | POA: Diagnosis not present

## 2017-09-17 DIAGNOSIS — Z3A36 36 weeks gestation of pregnancy: Secondary | ICD-10-CM | POA: Diagnosis not present

## 2017-09-17 DIAGNOSIS — R262 Difficulty in walking, not elsewhere classified: Secondary | ICD-10-CM | POA: Diagnosis not present

## 2017-09-17 DIAGNOSIS — O99353 Diseases of the nervous system complicating pregnancy, third trimester: Secondary | ICD-10-CM | POA: Diagnosis not present

## 2017-09-17 DIAGNOSIS — G479 Sleep disorder, unspecified: Secondary | ICD-10-CM | POA: Diagnosis not present

## 2017-09-17 DIAGNOSIS — R102 Pelvic and perineal pain: Secondary | ICD-10-CM | POA: Diagnosis not present

## 2017-09-17 DIAGNOSIS — O26813 Pregnancy related exhaustion and fatigue, third trimester: Secondary | ICD-10-CM | POA: Diagnosis not present

## 2017-09-17 DIAGNOSIS — Z3482 Encounter for supervision of other normal pregnancy, second trimester: Secondary | ICD-10-CM | POA: Diagnosis not present

## 2017-09-24 DIAGNOSIS — O99212 Obesity complicating pregnancy, second trimester: Secondary | ICD-10-CM | POA: Diagnosis not present

## 2017-09-24 DIAGNOSIS — Z3483 Encounter for supervision of other normal pregnancy, third trimester: Secondary | ICD-10-CM | POA: Diagnosis not present

## 2017-09-24 DIAGNOSIS — F419 Anxiety disorder, unspecified: Secondary | ICD-10-CM | POA: Diagnosis not present

## 2017-09-30 DIAGNOSIS — Z87891 Personal history of nicotine dependence: Secondary | ICD-10-CM | POA: Diagnosis not present

## 2017-09-30 DIAGNOSIS — Z3A38 38 weeks gestation of pregnancy: Secondary | ICD-10-CM | POA: Diagnosis not present

## 2017-09-30 DIAGNOSIS — E669 Obesity, unspecified: Secondary | ICD-10-CM | POA: Diagnosis not present

## 2017-09-30 DIAGNOSIS — O9902 Anemia complicating childbirth: Secondary | ICD-10-CM | POA: Diagnosis not present

## 2017-09-30 DIAGNOSIS — O4202 Full-term premature rupture of membranes, onset of labor within 24 hours of rupture: Secondary | ICD-10-CM | POA: Diagnosis not present

## 2017-09-30 DIAGNOSIS — D649 Anemia, unspecified: Secondary | ICD-10-CM | POA: Diagnosis not present

## 2017-09-30 DIAGNOSIS — Z3483 Encounter for supervision of other normal pregnancy, third trimester: Secondary | ICD-10-CM | POA: Diagnosis not present

## 2017-09-30 DIAGNOSIS — O99214 Obesity complicating childbirth: Secondary | ICD-10-CM | POA: Diagnosis not present

## 2017-09-30 DIAGNOSIS — O99344 Other mental disorders complicating childbirth: Secondary | ICD-10-CM | POA: Diagnosis not present

## 2017-09-30 DIAGNOSIS — F419 Anxiety disorder, unspecified: Secondary | ICD-10-CM | POA: Diagnosis not present

## 2017-11-04 DIAGNOSIS — R197 Diarrhea, unspecified: Secondary | ICD-10-CM | POA: Diagnosis not present

## 2017-11-04 DIAGNOSIS — O99212 Obesity complicating pregnancy, second trimester: Secondary | ICD-10-CM | POA: Diagnosis not present

## 2017-11-04 DIAGNOSIS — F419 Anxiety disorder, unspecified: Secondary | ICD-10-CM | POA: Diagnosis not present

## 2017-11-04 DIAGNOSIS — Z8659 Personal history of other mental and behavioral disorders: Secondary | ICD-10-CM | POA: Diagnosis not present

## 2017-11-04 DIAGNOSIS — Z09 Encounter for follow-up examination after completed treatment for conditions other than malignant neoplasm: Secondary | ICD-10-CM | POA: Diagnosis not present

## 2017-11-29 ENCOUNTER — Ambulatory Visit: Payer: BLUE CROSS/BLUE SHIELD | Admitting: Obstetrics & Gynecology

## 2017-11-29 ENCOUNTER — Encounter: Payer: Self-pay | Admitting: Obstetrics & Gynecology

## 2017-11-29 VITALS — BP 138/90 | Ht 63.0 in | Wt 174.0 lb

## 2017-11-29 DIAGNOSIS — N907 Vulvar cyst: Secondary | ICD-10-CM

## 2017-11-29 DIAGNOSIS — N762 Acute vulvitis: Secondary | ICD-10-CM | POA: Diagnosis not present

## 2017-11-29 MED ORDER — CEPHALEXIN 500 MG PO CAPS: 500.0000 mg | ORAL_CAPSULE | Freq: Four times a day (QID) | ORAL | 2 refills | Status: DC

## 2017-11-29 NOTE — Progress Notes (Signed)
HPI:      [name] is a 27 y.o. G2P1001, Patient's last menstrual period was 01/01/2017 (exact date)., presents today for a problem visit.  She complains of:  Vulvar concern:   This is a 27 y.o. old Caucasian/White female who presents for the evaluation of vulvar lesion(s). She describes the vulvar lesion(s) as elevated, ovoid, tender. She indicates that she has noticed 1 lesions She indicates she first noticed the problem one week ago. She admits to symptoms of pain. No heat, drainage, discharge.  No fever.  Some n/v. The following aggravating factors are identified: physical activity and wearing tight clothing. The following alleviating factors are identified: none. She has tried Sitz baths without relief Shehas had no previous colposcopy for this condition. The lesion has had no been biopsied. She has had no previous treatment for this condition.   PMHx: She  has a past medical history of Family history of breast cancer (02/2017) and No known health problems. Also,  has a past surgical history that includes No past surgeries., family history includes Breast cancer (age of onset: 42) in her maternal grandmother; Diabetes in her maternal grandmother; Uterine cancer (age of onset: 31) in her mother.,  reports that she has quit smoking. She has never used smokeless tobacco. She reports that she does not drink alcohol or use drugs.  She has a current medication list which includes the following prescription(s): docusate sodium, ondansetron, promethazine, and cephalexin. Also, has No Known Allergies.  Review of Systems  Constitutional: Negative for chills, fever and malaise/fatigue.  HENT: Negative for congestion, sinus pain and sore throat.   Eyes: Negative for blurred vision and pain.  Respiratory: Negative for cough and wheezing.   Cardiovascular: Negative for chest pain and leg swelling.  Gastrointestinal: Positive for nausea. Negative for abdominal pain, constipation, diarrhea,  heartburn and vomiting.  Genitourinary: Negative for dysuria, frequency, hematuria and urgency.  Musculoskeletal: Negative for back pain, joint pain, myalgias and neck pain.  Skin: Negative for itching and rash.  Neurological: Negative for dizziness, tremors and weakness.  Endo/Heme/Allergies: Does not bruise/bleed easily.  Psychiatric/Behavioral: Negative for depression. The patient is not nervous/anxious and does not have insomnia.    Objective: BP 138/90   Ht 5\' 3"  (1.6 m)   Wt 174 lb (78.9 kg)   LMP 01/01/2017 (Exact Date)   Breastfeeding? Unknown   BMI 30.82 kg/m  Physical Exam  Constitutional: She is oriented to person, place, and time. She appears well-developed and well-nourished. No distress.  Genitourinary: Vagina normal and uterus normal. Pelvic exam was performed with patient supine. There is no rash, tenderness or lesion on the right labia. There is no rash, tenderness or lesion on the left labia. No erythema or bleeding in the vagina. Right adnexum does not display mass and does not display tenderness. Left adnexum does not display mass and does not display tenderness. Cervix does not exhibit motion tenderness, discharge, polyp or nabothian cyst.   Uterus is mobile and midaxial. Uterus is not enlarged or exhibiting a mass.  Genitourinary Comments: Right cystic lesion on outer labia (not Bartholins).  Mild surrounding erythema  HENT:  Head: Normocephalic and atraumatic.  Nose: Nose normal.  Mouth/Throat: Oropharynx is clear and moist.  Abdominal: Soft. She exhibits no distension. There is no tenderness.  Musculoskeletal: Normal range of motion.  Neurological: She is alert and oriented to person, place, and time. No cranial nerve deficit.  Skin: Skin is warm and dry.  Psychiatric: She has a normal  mood and affect.   ASSESSMENT/PLAN:   Problem List Items Addressed This Visit      Genitourinary   Labial cyst - Primary           Sweat or hair follicle cystic lesion,  likely to drain soon.  Some erythema so mild cellulitis present.  Sitz, Heat, Rest, Keflex. Monitor for recurrence  Rhonda Major, MD, [facility], [facility] 11/29/2017  11:32 AM

## 2018-02-28 ENCOUNTER — Encounter: Payer: Self-pay | Admitting: Physician Assistant

## 2018-02-28 ENCOUNTER — Ambulatory Visit: Payer: BLUE CROSS/BLUE SHIELD | Admitting: Physician Assistant

## 2018-02-28 DIAGNOSIS — R7989 Other specified abnormal findings of blood chemistry: Secondary | ICD-10-CM | POA: Diagnosis not present

## 2018-02-28 DIAGNOSIS — F419 Anxiety disorder, unspecified: Secondary | ICD-10-CM | POA: Diagnosis not present

## 2018-02-28 DIAGNOSIS — F329 Major depressive disorder, single episode, unspecified: Secondary | ICD-10-CM

## 2018-02-28 DIAGNOSIS — Z13220 Encounter for screening for lipoid disorders: Secondary | ICD-10-CM | POA: Diagnosis not present

## 2018-02-28 DIAGNOSIS — R110 Nausea: Secondary | ICD-10-CM

## 2018-02-28 DIAGNOSIS — R197 Diarrhea, unspecified: Secondary | ICD-10-CM | POA: Diagnosis not present

## 2018-02-28 DIAGNOSIS — F32A Depression, unspecified: Secondary | ICD-10-CM

## 2018-02-28 DIAGNOSIS — D649 Anemia, unspecified: Secondary | ICD-10-CM

## 2018-02-28 MED ORDER — SERTRALINE HCL 50 MG PO TABS: 50.0000 mg | ORAL_TABLET | Freq: Every day | ORAL | 0 refills | Status: DC

## 2018-02-28 MED ORDER — ONDANSETRON HCL 4 MG PO TABS: 4.0000 mg | ORAL_TABLET | Freq: Three times a day (TID) | ORAL | 0 refills | Status: DC | PRN

## 2018-02-28 NOTE — Progress Notes (Signed)
Patient: Rhonda Sweeney, Female    DOB: 17-Oct-1990, 27 y.o.   MRN: [id number] Visit Date: 03/24/2018  Today's Provider: Trey SailorsAdriana M Pollak, PA-C   Chief Complaint  Patient presents with  . New Patient (Initial Visit)   Subjective:    Establish Care: Rhonda Sweeney is a 27 y.o. female who presents today to establish care. She feels poorly.Patient states that her pantorazole 20 mg and Lexapro 20 mg is not helping like before she delivered her baby. She reports exercising none. She reports she is sleeping fairly well due to having a 595 month old baby. Not currently breast feeding. She was previously seen by PCP in [place]. 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 She lives with mother brother and daughter and son aged 155.   She reports worsening anxiety and depression. She is on Lexapro 20 mg which worked prior to delivering her most recent child but she feels this has stopped working.   She also reports frequent nausea, bloating and vomiting of stomach contents. Does not relate this to eating. Denies focal abdominal pain. Reports having all of her organs. Denies family history of colon cancer of IBD. She also reports diarrhea. She endorses eating a lot of dairy products. She denies blood in her stool or weight loss.   Wt Readings from Last 3 Encounters:  11/29/17 174 lb (78.9 kg)  06/14/17 186 lb (84.4 kg)  05/17/17 176 lb (79.8 kg)    -----------------------------------------------------------------   Review of Systems  Constitutional: Positive for appetite change, fatigue and unexpected weight change.  Gastrointestinal: Positive for constipation, diarrhea, nausea and vomiting.  Neurological: Positive for dizziness, light-headedness and headaches.  Psychiatric/Behavioral: Positive for decreased concentration. The patient is nervous/anxious.     Social History      She  reports that she has quit smoking. She uses smokeless tobacco. She reports current alcohol  use of about 3.0 - 4.0 standard drinks of alcohol per week. She reports that she does not use drugs.       Social History   Socioeconomic History  . Marital status: Married    Spouse name: Not on file  . Number of children: Not on file  . Years of education: Not on file  . Highest education level: Not on file  Occupational History  . Not on file  Social Needs  . Financial resource strain: Not on file  . Food insecurity:    Worry: Not on file    Inability: Not on file  . Transportation needs:    Medical: Not on file    Non-medical: Not on file  Tobacco Use  . Smoking status: Former Games developermoker  . Smokeless tobacco: Current User  Substance and Sexual Activity  . Alcohol use: Yes    Alcohol/week: 3.0 - 4.0 standard drinks    Types: 3 - 4 Glasses of wine per week    Frequency: Never  . Drug use: No  . Sexual activity: Yes    Birth control/protection: None  Lifestyle  . Physical activity:    Days per week: Not on file    Minutes per session: Not on file  . Stress: Not on file  Relationships  . Social connections:    Talks on phone: Not on file    Gets together: Not on file    Attends religious service: Not on file    Active member of club or organization: Not on file    Attends meetings of  clubs or organizations: Not on file    Relationship status: Not on file  Other Topics Concern  . Not on file  Social History Narrative  . Not on file    Past Medical History:  Diagnosis Date  . Anxiety   . Depression   . Family history of breast cancer 02/2017   genetic testing letter sent  . GERD (gastroesophageal reflux disease)   . No known health problems      Patient Active Problem List   Diagnosis Date Noted  . Labial cyst 11/29/2017    Past Surgical History:  Procedure Laterality Date  . NO PAST SURGERIES      Family History        Family Status  Relation Name Status  . MGM  (Not Specified)  . Mother  (Not Specified)  . Neg Hx  (Not Specified)        Her  family history includes Breast cancer (age of onset: 15) in her maternal grandmother; Diabetes in her maternal grandmother; Uterine cancer (age of onset: 49) in her mother. There is no history of Cancer.      No Known Allergies   Current Outpatient Medications:  .  pantoprazole (PROTONIX) 20 MG tablet, Take 20 mg by mouth daily., Disp: , Rfl: 2 .  ondansetron (ZOFRAN) 4 MG tablet, Take 1 tablet (4 mg total) by mouth every 8 (eight) hours as needed for nausea or vomiting., Disp: 20 tablet, Rfl: 0 .  sertraline (ZOLOFT) 50 MG tablet, Take 1 tablet (50 mg total) by mouth daily., Disp: 90 tablet, Rfl: 0   Patient Care Team: Maryella Shivers as PCP - General (Physician Assistant)      Objective:   Vitals: There were no vitals taken for this visit.  There were no vitals filed for this visit.   Physical Exam Constitutional:      Appearance: Normal appearance.  Neck:     Musculoskeletal: Normal range of motion.  Cardiovascular:     Rate and Rhythm: Normal rate and regular rhythm.     Pulses: Normal pulses.     Heart sounds: Normal heart sounds.  Pulmonary:     Effort: Pulmonary effort is normal.     Breath sounds: Normal breath sounds.  Abdominal:     General: Abdomen is flat. Bowel sounds are normal.     Palpations: Abdomen is soft.  Skin:    General: Skin is warm and dry.  Neurological:     General: No focal deficit present.     Mental Status: She is alert and oriented to person, place, and time.  Psychiatric:        Mood and Affect: Mood normal.        Behavior: Behavior normal.      Depression Screen PHQ 2/9 Scores 02/28/2018  PHQ - 2 Score 4  PHQ- 9 Score 13      Assessment & Plan:     Routine Health Maintenance and Physical Exam  Exercise Activities and Dietary recommendations Goals   None      There is no immunization history on file for this patient.  Health Maintenance  Topic Date Due  . TETANUS/TDAP  10/11/2010  . INFLUENZA VACCINE   10/24/2017  . PAP-Cervical Cytology Screening  02/09/2020  . PAP SMEAR-Modifier  02/09/2020  . HIV Screening  Completed     Discussed health benefits of physical activity, and encouraged her to engage in regular exercise appropriate for her age and condition.  1. Anxiety and depression  Will change her medication as below, follow up in one month.   - sertraline (ZOLOFT) 50 MG tablet; Take 1 tablet (50 mg total) by mouth daily.  Dispense: 90 tablet; Refill: 0  2. Nausea  Keep food diary, try dairy elimination. Review at next visit.  - ondansetron (ZOFRAN) 4 MG tablet; Take 1 tablet (4 mg total) by mouth every 8 (eight) hours as needed for nausea or vomiting.  Dispense: 20 tablet; Refill: 0  3. Low TSH level  - TSH  4. Screening cholesterol level  - Lipid Profile  5. Anemia, unspecified type  - CBC with Differential  6. Diarrhea, unspecified type  - Comprehensive Metabolic Panel (CMET)  Return in about 1 month (around 03/31/2018) for anxiety.     --------------------------------------------------------------------    Trey Sailors, PA-C  [facility]

## 2018-03-01 LAB — COMPREHENSIVE METABOLIC PANEL
ALT: 26 IU/L (ref 0–32)
AST: 23 IU/L (ref 0–40)
Albumin/Globulin Ratio: 1.7 (ref 1.2–2.2)
Albumin: 4.7 g/dL (ref 3.5–5.5)
Alkaline Phosphatase: 86 IU/L (ref 39–117)
BUN/Creatinine Ratio: 10 (ref 9–23)
BUN: 10 mg/dL (ref 6–20)
Bilirubin Total: 0.4 mg/dL (ref 0.0–1.2)
CO2: 21 mmol/L (ref 20–29)
Calcium: 9.6 mg/dL (ref 8.7–10.2)
Chloride: 102 mmol/L (ref 96–106)
Creatinine, Ser: 1.04 mg/dL — ABNORMAL HIGH (ref 0.57–1.00)
GFR calc Af Amer: 86 mL/min/{1.73_m2} (ref >59)
GFR calc non Af Amer: 74 mL/min/{1.73_m2} (ref >59)
Globulin, Total: 2.7 g/dL (ref 1.5–4.5)
Glucose: 88 mg/dL (ref 65–99)
Potassium: 4.4 mmol/L (ref 3.5–5.2)
Sodium: 139 mmol/L (ref 134–144)
Total Protein: 7.4 g/dL (ref 6.0–8.5)

## 2018-03-01 LAB — CBC WITH DIFFERENTIAL/PLATELET
Basophils Absolute: 0.1 10*3/uL (ref 0.0–0.2)
Basos: 1 %
EOS (ABSOLUTE): 0.2 10*3/uL (ref 0.0–0.4)
Eos: 3 %
Hematocrit: 40.5 % (ref 34.0–46.6)
Hemoglobin: 13.6 g/dL (ref 11.1–15.9)
Immature Grans (Abs): 0 10*3/uL (ref 0.0–0.1)
Immature Granulocytes: 0 %
Lymphocytes Absolute: 1.6 10*3/uL (ref 0.7–3.1)
Lymphs: 30 %
MCH: 27.8 pg (ref 26.6–33.0)
MCHC: 33.6 g/dL (ref 31.5–35.7)
MCV: 83 fL (ref 79–97)
Monocytes Absolute: 0.3 10*3/uL (ref 0.1–0.9)
Monocytes: 6 %
Neutrophils Absolute: 3.2 10*3/uL (ref 1.4–7.0)
Neutrophils: 60 %
Platelets: 366 10*3/uL (ref 150–450)
RBC: 4.89 x10E6/uL (ref 3.77–5.28)
RDW: 13.6 % (ref 12.3–15.4)
WBC: 5.3 10*3/uL (ref 3.4–10.8)

## 2018-03-01 LAB — LIPID PANEL
Chol/HDL Ratio: 4.9 ratio — ABNORMAL HIGH (ref 0.0–4.4)
Cholesterol, Total: 185 mg/dL (ref 100–199)
HDL: 38 mg/dL — ABNORMAL LOW (ref >39)
LDL Calculated: 112 mg/dL — ABNORMAL HIGH (ref 0–99)
Triglycerides: 177 mg/dL — ABNORMAL HIGH (ref 0–149)
VLDL Cholesterol Cal: 35 mg/dL (ref 5–40)

## 2018-03-01 LAB — TSH: TSH: 1.23 u[IU]/mL (ref 0.450–4.500)

## 2018-03-04 ENCOUNTER — Encounter: Payer: Self-pay | Admitting: Physician Assistant

## 2018-03-04 ENCOUNTER — Telehealth: Payer: Self-pay | Admitting: Physician Assistant

## 2018-03-04 NOTE — Telephone Encounter (Signed)
Pt needing recent lab results.  Thanks, [facility] [employer]

## 2018-03-04 NOTE — Telephone Encounter (Signed)
Please advise results? 

## 2018-03-05 ENCOUNTER — Telehealth: Payer: Self-pay

## 2018-03-05 NOTE — Telephone Encounter (Signed)
-----   Message from Trey SailorsAdriana M Pollak, [place] sent at 03/05/2018  9:31 AM EST ----- Her cholesterol is slightly above normal but otherwise other labwork is normal.

## 2018-03-05 NOTE — Telephone Encounter (Signed)
Patient advised as directed below.  Thanks,  -Joseline 

## 2018-03-21 DIAGNOSIS — J019 Acute sinusitis, unspecified: Secondary | ICD-10-CM | POA: Diagnosis not present

## 2018-03-21 DIAGNOSIS — R509 Fever, unspecified: Secondary | ICD-10-CM | POA: Diagnosis not present

## 2018-03-21 DIAGNOSIS — R05 Cough: Secondary | ICD-10-CM | POA: Diagnosis not present

## 2018-03-21 DIAGNOSIS — J029 Acute pharyngitis, unspecified: Secondary | ICD-10-CM | POA: Diagnosis not present

## 2018-03-24 ENCOUNTER — Other Ambulatory Visit: Payer: Self-pay | Admitting: Physician Assistant

## 2018-03-24 DIAGNOSIS — R110 Nausea: Secondary | ICD-10-CM

## 2018-03-28 ENCOUNTER — Encounter: Payer: Self-pay | Admitting: Physician Assistant

## 2018-03-28 ENCOUNTER — Ambulatory Visit: Payer: BLUE CROSS/BLUE SHIELD | Admitting: Physician Assistant

## 2018-03-28 VITALS — BP 122/82 | HR 89 | Temp 98.2°F | Wt 168.0 lb

## 2018-03-28 DIAGNOSIS — R110 Nausea: Secondary | ICD-10-CM | POA: Diagnosis not present

## 2018-03-28 DIAGNOSIS — F329 Major depressive disorder, single episode, unspecified: Secondary | ICD-10-CM | POA: Diagnosis not present

## 2018-03-28 DIAGNOSIS — F32A Depression, unspecified: Secondary | ICD-10-CM

## 2018-03-28 DIAGNOSIS — F419 Anxiety disorder, unspecified: Secondary | ICD-10-CM | POA: Diagnosis not present

## 2018-03-28 MED ORDER — ONDANSETRON HCL 4 MG PO TABS: ORAL_TABLET | ORAL | 0 refills | Status: DC

## 2018-03-28 NOTE — Patient Instructions (Addendum)
magnesium oxide 400 mg daily  B2 riboflavin 100 mg daily    Analgesic Rebound Headache An analgesic rebound headache, sometimes called a medication overuse headache, is a headache that comes after pain medicine (analgesic) taken to treat the original (primary) headache has worn off. Any type of primary headache can return as a rebound headache if a person regularly takes analgesics more than three times a week to treat it. The types of primary headaches that are commonly associated with rebound headaches include:  Migraines.  Headaches that arise from tense muscles in the head and neck area (tension headaches).  Headaches that develop and happen again (recur) on one side of the head and around the eye (cluster headaches). If rebound headaches continue, they become chronic daily headaches. What are the causes? This condition may be caused by frequent use of:  Over-the-counter medicines such as aspirin, ibuprofen, and acetaminophen.  Sinus relief medicines and other medicines that contain caffeine.  Narcotic pain medicines such as codeine and oxycodone. What are the signs or symptoms? The symptoms of a rebound headache are the same as the symptoms of the original headache. Some of the symptoms of specific types of headaches include: Migraine headache  Pulsing or throbbing pain on one or both sides of the head.  Severe pain that interferes with daily activities.  Pain that is worsened by physical activity.  Nausea, vomiting, or both.  Pain with exposure to bright light, loud noises, or strong smells.  General sensitivity to bright light, loud noises, or strong smells.  Visual changes.  Numbness of one or both arms. Tension headache  Pressure around the head.  Dull, aching head pain.  Pain felt over the front and sides of the head.  Tenderness in the muscles of the head, neck, and shoulders. Cluster headache  Severe pain that begins in or around one eye or  temple.  Redness and tearing in the eye on the same side as the pain.  Droopy or swollen eyelid.  One-sided head pain.  Nausea.  Runny nose.  Sweaty, pale facial skin.  Restlessness. How is this diagnosed? This condition is diagnosed by:  Reviewing your medical history. This includes the nature of your primary headaches.  Reviewing the types of pain medicines that you have been using to treat your headaches and how often you take them. How is this treated? This condition may be treated or managed by:  Discontinuing frequent use of the analgesic medicine. Doing this may worsen your headaches at first, but the pain should eventually become more manageable, less frequent, and less severe.  Seeing a headache specialist. He or she may be able to help you manage your headaches and help make sure there is not another cause of the headaches.  Using methods of stress relief, such as acupuncture, counseling, biofeedback, and massage. Talk with your health care provider about which methods might be good for you. Follow these instructions at home:  Take over-the-counter and prescription medicines only as told by your health care provider.  Stop the repeated use of pain medicine as told by your health care provider. Stopping can be difficult. Carefully follow instructions from your health care provider.  Avoid triggers that are known to cause your primary headaches.  Keep all follow-up visits as told by your health care provider. This is important. Contact a health care provider if:  You continue to experience headaches after following treatments that your health care provider recommended. Get help right away if:  You develop new  headache pain.  You develop headache pain that is different than what you have experienced in the past.  You develop numbness or tingling in your arms or legs.  You develop changes in your speech or vision. This information is not intended to replace  advice given to you by your health care provider. Make sure you discuss any questions you have with your health care provider. Document Released: 06/02/2003 Document Revised: 09/30/2015 Document Reviewed: 08/15/2015 Elsevier Interactive Patient Education  2019 [employer].

## 2018-03-28 NOTE — Progress Notes (Signed)
Patient: Rhonda FuchsDarrien N Stovall Female    DOB: Jun 10, 1990   28 y.o.   MRN: [id number] Visit Date: 03/28/2018  Today's Provider: Trey SailorsAdriana M Pollak, PA-C   Chief Complaint  Patient presents with  . Depression  . Headache   Subjective:     HPI   Depression, Follow-up  Shewas last seen for this 1 monthsago. Changes made at last visit include: Sertraline 50 mg by mouth daily.  Shereports (excellent)compliance with treatment. She (is not)having side effects  Shereports (good)tolerance of treatment. Current symptoms include: Patient states that she is feel a lot better since she started the Sertraline.   Depression screen PHQ 2/9 02/28/2018  Decreased Interest 2  Down, Depressed, Hopeless 2  PHQ - 2 Score 4  Altered sleeping 2  Tired, decreased energy 3  Change in appetite 3  Feeling bad or failure about yourself  0  Trouble concentrating 1  Moving slowly or fidgety/restless 0  Suicidal thoughts 0  PHQ-9 Score 13  Difficult doing work/chores Very difficult     [facility]/2018 [facility]  PHQ-9 Total Score  7       Headaches Patient states she has been having headaches and used to take Excedrin migraine for them but stop. Patient thinks that it could be the reason why she has been having the nausea because she was taking 6 tablets a day.  No Known Allergies   Current Outpatient Medications:  .  ondansetron (ZOFRAN) 4 MG tablet, TAKE 1 TABLET BY MOUTH EVERY 8 HOURS AS NEEDED FOR NAUSEA AND VOMITING, Disp: 20 tablet, Rfl: 0 .  pantoprazole (PROTONIX) 20 MG tablet, Take 20 mg by mouth daily., Disp: , Rfl: 2 .  sertraline (ZOLOFT) 50 MG tablet, Take 1 tablet (50 mg total) by mouth daily., Disp: 90 tablet, Rfl: 0  Review of Systems  Constitutional: Negative.   HENT: Negative.   Respiratory: Negative.   Gastrointestinal: Positive for nausea.  Genitourinary: Negative.   Neurological: Positive for headaches.    Psychiatric/Behavioral: Positive for decreased concentration.    Social History   Tobacco Use  . Smoking status: Former Games developermoker  . Smokeless tobacco: Current User  Substance Use Topics  . Alcohol use: Yes    Alcohol/week: 3.0 - 4.0 standard drinks    Types: 3 - 4 Glasses of wine per week    Frequency: Never      Objective:   BP 122/82 (BP Location: Left Arm, Patient Position: Sitting, Cuff Size: Normal)   Pulse 89   Temp 98.2 F (36.8 C) (Oral)   Wt 168 lb (76.2 kg)   LMP 03/27/2018 (Exact Date)   SpO2 99%   BMI 29.76 kg/m  Vitals:   03/28/18 1631  BP: 122/82  Pulse: 89  Temp: 98.2 F (36.8 C)  TempSrc: Oral  SpO2: 99%  Weight: 168 lb (76.2 kg)     Physical Exam Constitutional:      Appearance: She is well-developed.  Cardiovascular:     Rate and Rhythm: Normal rate.  Pulmonary:     Effort: Pulmonary effort is normal.  Neurological:     Mental Status: She is alert.  Psychiatric:        Mood and Affect: Mood normal.        Behavior: Behavior normal.         Assessment & Plan    1. Anxiety and depression   Improved, continue zoloft.   2. Nausea  She has  been taking 6 excedrin daily. She could possibly have gastritis from this which may be causing her nausea. Have advised her to discontinue excedrin, she will likely experience rebound headache. Medication filled as below.  - ondansetron (ZOFRAN) 4 MG tablet; TAKE 1 TABLET BY MOUTH EVERY 8 HOURS AS NEEDED FOR NAUSEA AND VOMITING  Dispense: 20 tablet; Refill: 0  Return in about 6 months (around 09/26/2018) for depression .  The entirety of the information documented in the History of Present Illness, Review of Systems and Physical Exam were personally obtained by me. Portions of this information were initially documented by Hetty ElyJoseline Rosas, CMA and reviewed by me for thoroughness and accuracy.          Trey SailorsAdriana M Pollak, PA-C  [facility]

## 2018-04-04 DIAGNOSIS — F32A Depression, unspecified: Secondary | ICD-10-CM | POA: Insufficient documentation

## 2018-04-04 DIAGNOSIS — F329 Major depressive disorder, single episode, unspecified: Secondary | ICD-10-CM | POA: Insufficient documentation

## 2018-04-04 DIAGNOSIS — F419 Anxiety disorder, unspecified: Secondary | ICD-10-CM | POA: Insufficient documentation

## 2018-05-22 ENCOUNTER — Other Ambulatory Visit: Payer: Self-pay | Admitting: Physician Assistant

## 2018-05-22 DIAGNOSIS — F329 Major depressive disorder, single episode, unspecified: Secondary | ICD-10-CM

## 2018-05-22 DIAGNOSIS — F32A Depression, unspecified: Secondary | ICD-10-CM

## 2018-05-22 DIAGNOSIS — F419 Anxiety disorder, unspecified: Principal | ICD-10-CM

## 2018-08-20 DIAGNOSIS — F341 Dysthymic disorder: Secondary | ICD-10-CM | POA: Diagnosis not present

## 2018-08-20 DIAGNOSIS — F411 Generalized anxiety disorder: Secondary | ICD-10-CM | POA: Diagnosis not present

## 2018-08-27 DIAGNOSIS — F341 Dysthymic disorder: Secondary | ICD-10-CM | POA: Diagnosis not present

## 2018-08-27 DIAGNOSIS — F411 Generalized anxiety disorder: Secondary | ICD-10-CM | POA: Diagnosis not present

## 2018-09-03 DIAGNOSIS — F411 Generalized anxiety disorder: Secondary | ICD-10-CM | POA: Diagnosis not present

## 2018-09-03 DIAGNOSIS — F341 Dysthymic disorder: Secondary | ICD-10-CM | POA: Diagnosis not present

## 2018-09-10 DIAGNOSIS — F341 Dysthymic disorder: Secondary | ICD-10-CM | POA: Diagnosis not present

## 2018-09-10 DIAGNOSIS — F411 Generalized anxiety disorder: Secondary | ICD-10-CM | POA: Diagnosis not present

## 2018-09-17 DIAGNOSIS — F341 Dysthymic disorder: Secondary | ICD-10-CM | POA: Diagnosis not present

## 2018-09-17 DIAGNOSIS — F411 Generalized anxiety disorder: Secondary | ICD-10-CM | POA: Diagnosis not present

## 2018-09-24 DIAGNOSIS — F341 Dysthymic disorder: Secondary | ICD-10-CM | POA: Diagnosis not present

## 2018-09-24 DIAGNOSIS — F411 Generalized anxiety disorder: Secondary | ICD-10-CM | POA: Diagnosis not present

## 2018-10-15 DIAGNOSIS — F341 Dysthymic disorder: Secondary | ICD-10-CM | POA: Diagnosis not present

## 2018-10-15 DIAGNOSIS — F411 Generalized anxiety disorder: Secondary | ICD-10-CM | POA: Diagnosis not present

## 2018-10-22 DIAGNOSIS — F411 Generalized anxiety disorder: Secondary | ICD-10-CM | POA: Diagnosis not present

## 2018-10-22 DIAGNOSIS — F341 Dysthymic disorder: Secondary | ICD-10-CM | POA: Diagnosis not present

## 2018-10-29 DIAGNOSIS — F341 Dysthymic disorder: Secondary | ICD-10-CM | POA: Diagnosis not present

## 2018-10-29 DIAGNOSIS — F411 Generalized anxiety disorder: Secondary | ICD-10-CM | POA: Diagnosis not present

## 2018-11-05 DIAGNOSIS — F411 Generalized anxiety disorder: Secondary | ICD-10-CM | POA: Diagnosis not present

## 2018-11-05 DIAGNOSIS — F341 Dysthymic disorder: Secondary | ICD-10-CM | POA: Diagnosis not present

## 2018-11-12 DIAGNOSIS — F411 Generalized anxiety disorder: Secondary | ICD-10-CM | POA: Diagnosis not present

## 2018-11-12 DIAGNOSIS — F341 Dysthymic disorder: Secondary | ICD-10-CM | POA: Diagnosis not present

## 2018-11-26 DIAGNOSIS — F341 Dysthymic disorder: Secondary | ICD-10-CM | POA: Diagnosis not present

## 2018-11-26 DIAGNOSIS — F411 Generalized anxiety disorder: Secondary | ICD-10-CM | POA: Diagnosis not present

## 2018-12-03 DIAGNOSIS — F341 Dysthymic disorder: Secondary | ICD-10-CM | POA: Diagnosis not present

## 2018-12-03 DIAGNOSIS — F411 Generalized anxiety disorder: Secondary | ICD-10-CM | POA: Diagnosis not present

## 2018-12-11 DIAGNOSIS — U071 COVID-19: Secondary | ICD-10-CM | POA: Diagnosis not present

## 2018-12-11 DIAGNOSIS — R430 Anosmia: Secondary | ICD-10-CM | POA: Diagnosis not present

## 2018-12-13 DIAGNOSIS — R430 Anosmia: Secondary | ICD-10-CM | POA: Diagnosis not present

## 2018-12-13 DIAGNOSIS — U071 COVID-19: Secondary | ICD-10-CM | POA: Diagnosis not present

## 2019-05-12 ENCOUNTER — Other Ambulatory Visit: Payer: Self-pay | Admitting: Physician Assistant

## 2019-05-12 DIAGNOSIS — F419 Anxiety disorder, unspecified: Secondary | ICD-10-CM

## 2019-05-12 DIAGNOSIS — F32A Depression, unspecified: Secondary | ICD-10-CM

## 2019-05-12 DIAGNOSIS — F329 Major depressive disorder, single episode, unspecified: Secondary | ICD-10-CM

## 2019-05-12 NOTE — Telephone Encounter (Signed)
Patient last office visit 03/28/18. Called to schedule patient for medication refill and follow up for depression/anxiety. LMTCB.

## 2019-05-12 NOTE — Telephone Encounter (Signed)
Requested medication (s) are due for refill today: yes  Requested medication (s) are on the active medication list: yes  Last refill:  02/18/2019  Future visit scheduled: no  Notes to clinic:  no encounter within last 6 months   Requested Prescriptions  Pending Prescriptions Disp Refills   sertraline (ZOLOFT) 50 MG tablet [Pharmacy Med Name: SERTRALINE HCL 50 MG TABLET] 90 tablet 3    Sig: TAKE 1 TABLET BY MOUTH EVERY DAY      Psychiatry:  Antidepressants - SSRI Failed - 05/12/2019  1:05 AM      Failed - Completed PHQ-2 or PHQ-9 in the last 360 days.      Failed - Valid encounter within last 6 months    Recent Outpatient Visits           1 year ago Anxiety and depression   [facility] [place], [place], [place]   1 year ago Anxiety and depression   [facility] [place], [place]

## 2019-05-19 NOTE — Telephone Encounter (Signed)
Patient is scheduled to come in on 05/21/2019.

## 2019-05-21 ENCOUNTER — Telehealth (INDEPENDENT_AMBULATORY_CARE_PROVIDER_SITE_OTHER): Payer: BC Managed Care – PPO | Admitting: Physician Assistant

## 2019-05-21 DIAGNOSIS — R519 Headache, unspecified: Secondary | ICD-10-CM

## 2019-05-21 DIAGNOSIS — F329 Major depressive disorder, single episode, unspecified: Secondary | ICD-10-CM

## 2019-05-21 DIAGNOSIS — Z131 Encounter for screening for diabetes mellitus: Secondary | ICD-10-CM | POA: Diagnosis not present

## 2019-05-21 DIAGNOSIS — K219 Gastro-esophageal reflux disease without esophagitis: Secondary | ICD-10-CM

## 2019-05-21 DIAGNOSIS — F32A Depression, unspecified: Secondary | ICD-10-CM

## 2019-05-21 DIAGNOSIS — F419 Anxiety disorder, unspecified: Secondary | ICD-10-CM

## 2019-05-21 MED ORDER — TOPIRAMATE 50 MG PO TABS: ORAL_TABLET | ORAL | 0 refills | Status: DC

## 2019-05-21 MED ORDER — FAMOTIDINE 20 MG PO TABS: 20.0000 mg | ORAL_TABLET | Freq: Every day | ORAL | 0 refills | Status: DC

## 2019-05-21 NOTE — Progress Notes (Signed)
Patient: Rhonda Sweeney Female    DOB: Sep 29, 1990   29 y.o.   MRN: [id number] Visit Date: 05/21/2019  Today's Provider: Trinna Post, PA-C   Chief Complaint  Patient presents with  . Anxiety  . Depression   Subjective:    I, Rhonda Sweeney,CMA am acting as a Education administrator for [employer].  Virtual Visit via Video Note  I connected with Rhonda Sweeney on 05/21/19 at  1:20 PM EST by a video enabled telemedicine application and verified that I am speaking with the correct person using two identifiers.  Location: Patient: Home Provider: Office   I discussed the limitations of evaluation and management by telemedicine and the availability of in person appointments. The patient expressed understanding and agreed to proceed. HPI  Anxiety and Depression Patient presents today for anxiety and depression follow-up. Patient last office visit was on 03/28/2018. Patient is currently taking Zoloft 50 MG and reports good compliance with treatment. Reports a lot of help with depression and some anxiety. Reports she had sought out counseling but had to reschedule sessions due to pandemic and hasn't been seen in a couple of months.   GAD 7 : Generalized Anxiety Score 05/21/2019 02/28/2018  Nervous, Anxious, on Edge 0 3  Control/stop worrying 0 3  Worry too much - different things 2 3  Trouble relaxing 2 3  Restless 0 1  Easily annoyed or irritable 1 2  Afraid - awful might happen 0 1  Total GAD 7 Score 5 16  Anxiety Difficulty Somewhat difficult Very difficult    Depression screen [facility] 2/9 05/21/2019 03/28/2018 02/28/2018  Decreased Interest 0 1 2  Down, Depressed, Hopeless 0 0 2  PHQ - 2 Score 0 1 4  Altered sleeping 1 1 2   Tired, decreased energy 2 2 3   Change in appetite 0 2 3  Feeling bad or failure about yourself  0 0 0  Trouble concentrating 0 1 1  Moving slowly or fidgety/restless 0 0 0  Suicidal thoughts 0 0 0  PHQ-9 Score 3 7 13   Difficult doing work/chores  Somewhat difficult Somewhat difficult Very difficult   Headaches: Reports daily headaches for a year. Reports a band like sensation around her head on occasion. Sometimes will be on the top or the back. She takes two tylenol in the morning, afternoon and night every day for several months. Used to take excedrin. She has tried magnesium which she ultimately stopped due to diarrhea.   Pulse Readings from Last 8 Encounters:  03/28/18 89  02/08/17 95   BP Readings from Last 3 Encounters:  03/28/18 122/82  11/29/17 138/90  06/14/17 120/70     No Known Allergies   Current Outpatient Medications:  .  sertraline (ZOLOFT) 50 MG tablet, TAKE 1 TABLET BY MOUTH EVERY DAY, Disp: 90 tablet, Rfl: 0 .  ondansetron (ZOFRAN) 4 MG tablet, TAKE 1 TABLET BY MOUTH EVERY 8 HOURS AS NEEDED FOR NAUSEA AND VOMITING (Patient not taking: Reported on 05/21/2019), Disp: 20 tablet, Rfl: 0 .  pantoprazole (PROTONIX) 20 MG tablet, Take 20 mg by mouth daily., Disp: , Rfl: 2  Review of Systems  Social History   Tobacco Use  . Smoking status: Former Research scientist (life sciences)  . Smokeless tobacco: Current User  Substance Use Topics  . Alcohol use: Yes    Alcohol/week: 3.0 - 4.0 standard drinks    Types: 3 - 4 Glasses of wine per week      Objective:  There were no vitals taken for this visit. There were no vitals filed for this visit.There is no height or weight on file to calculate BMI.   Physical Exam Constitutional:      General: She is not in acute distress.    Appearance: Normal appearance. She is not ill-appearing.  Neurological:     Mental Status: She is alert.      No results found for any visits on 05/21/19.     Assessment & Plan    1. Anxiety and depression  She is taking zoloft 50 mg QD currently which she reports has helped with her depression. May increase to 100 mg QD in the future if she feels she needs a higher dose.   2. Acid Reflux   20 mg QD Pepcid PRN.  3. Headaches  Start Topamax.  Baseline labwork and follow up in 1 month.    I discussed the assessment and treatment plan with the patient. The patient was provided an opportunity to ask questions and all were answered. The patient agreed with the plan and demonstrated an understanding of the instructions.   The patient was advised to call back or seek an in-person evaluation if the symptoms worsen or if the condition fails to improve as anticipated.  I provided 25 minutes of non-face-to-face time during this encounter.     Trey Sailors, PA-C  [facility]

## 2019-06-10 ENCOUNTER — Telehealth: Payer: Self-pay

## 2019-06-10 DIAGNOSIS — F32A Depression, unspecified: Secondary | ICD-10-CM

## 2019-06-10 DIAGNOSIS — G43819 Other migraine, intractable, without status migrainosus: Secondary | ICD-10-CM

## 2019-06-10 DIAGNOSIS — F329 Major depressive disorder, single episode, unspecified: Secondary | ICD-10-CM

## 2019-06-10 DIAGNOSIS — F419 Anxiety disorder, unspecified: Secondary | ICD-10-CM

## 2019-06-10 NOTE — Telephone Encounter (Signed)
Copied from CRM [id number]. Topic: General - Inquiry >> Jun 10, 2019  8:41 AM Lynne Logan D wrote: Reason for CRM: Pt would like to have full panel labs done as discussed with Adrianna on 05/21/19. She would like to come in tomorrow to the lab. Please advise. Ok to reach out to pt via Clinical cytogeneticist.

## 2019-06-10 NOTE — Addendum Note (Signed)
Addended by: Cheron Every C on: 06/10/2019 12:12 PM   Modules accepted: Orders

## 2019-06-10 NOTE — Telephone Encounter (Signed)
Fine to place tsh, cmet, cbc, lipid under dx anxiety/migraine. We'll discuss results at follow up visit.

## 2019-06-10 NOTE — Telephone Encounter (Signed)
Orders was placed and patient was advised.

## 2019-06-11 DIAGNOSIS — F419 Anxiety disorder, unspecified: Secondary | ICD-10-CM | POA: Diagnosis not present

## 2019-06-11 DIAGNOSIS — F329 Major depressive disorder, single episode, unspecified: Secondary | ICD-10-CM | POA: Diagnosis not present

## 2019-06-11 DIAGNOSIS — G43819 Other migraine, intractable, without status migrainosus: Secondary | ICD-10-CM | POA: Diagnosis not present

## 2019-06-12 ENCOUNTER — Encounter: Payer: Self-pay | Admitting: Physician Assistant

## 2019-06-12 DIAGNOSIS — F329 Major depressive disorder, single episode, unspecified: Secondary | ICD-10-CM

## 2019-06-12 DIAGNOSIS — F32A Depression, unspecified: Secondary | ICD-10-CM

## 2019-06-12 DIAGNOSIS — F419 Anxiety disorder, unspecified: Secondary | ICD-10-CM

## 2019-06-12 LAB — LIPID PANEL
Chol/HDL Ratio: 3.8 ratio (ref 0.0–4.4)
Cholesterol, Total: 154 mg/dL (ref 100–199)
HDL: 41 mg/dL (ref >39)
LDL Chol Calc (NIH): 97 mg/dL (ref 0–99)
Triglycerides: 84 mg/dL (ref 0–149)
VLDL Cholesterol Cal: 16 mg/dL (ref 5–40)

## 2019-06-12 LAB — COMPREHENSIVE METABOLIC PANEL
ALT: 19 IU/L (ref 0–32)
AST: 18 IU/L (ref 0–40)
Albumin/Globulin Ratio: 2.2 (ref 1.2–2.2)
Albumin: 5.2 g/dL — ABNORMAL HIGH (ref 3.9–5.0)
Alkaline Phosphatase: 60 IU/L (ref 39–117)
BUN/Creatinine Ratio: 15 (ref 9–23)
BUN: 16 mg/dL (ref 6–20)
Bilirubin Total: 0.4 mg/dL (ref 0.0–1.2)
CO2: 18 mmol/L — ABNORMAL LOW (ref 20–29)
Calcium: 9.5 mg/dL (ref 8.7–10.2)
Chloride: 105 mmol/L (ref 96–106)
Creatinine, Ser: 1.06 mg/dL — ABNORMAL HIGH (ref 0.57–1.00)
GFR calc Af Amer: 83 mL/min/{1.73_m2} (ref >59)
GFR calc non Af Amer: 72 mL/min/{1.73_m2} (ref >59)
Globulin, Total: 2.4 g/dL (ref 1.5–4.5)
Glucose: 82 mg/dL (ref 65–99)
Potassium: 4.3 mmol/L (ref 3.5–5.2)
Sodium: 138 mmol/L (ref 134–144)
Total Protein: 7.6 g/dL (ref 6.0–8.5)

## 2019-06-12 LAB — CBC WITH DIFFERENTIAL/PLATELET
Basophils Absolute: 0.1 10*3/uL (ref 0.0–0.2)
Basos: 2 %
EOS (ABSOLUTE): 0.1 10*3/uL (ref 0.0–0.4)
Eos: 2 %
Hematocrit: 46.9 % — ABNORMAL HIGH (ref 34.0–46.6)
Hemoglobin: 15.7 g/dL (ref 11.1–15.9)
Immature Grans (Abs): 0 10*3/uL (ref 0.0–0.1)
Immature Granulocytes: 0 %
Lymphocytes Absolute: 1.4 10*3/uL (ref 0.7–3.1)
Lymphs: 32 %
MCH: 30.5 pg (ref 26.6–33.0)
MCHC: 33.5 g/dL (ref 31.5–35.7)
MCV: 91 fL (ref 79–97)
Monocytes Absolute: 0.3 10*3/uL (ref 0.1–0.9)
Monocytes: 8 %
Neutrophils Absolute: 2.5 10*3/uL (ref 1.4–7.0)
Neutrophils: 56 %
Platelets: 304 10*3/uL (ref 150–450)
RBC: 5.14 x10E6/uL (ref 3.77–5.28)
RDW: 12.2 % (ref 11.7–15.4)
WBC: 4.4 10*3/uL (ref 3.4–10.8)

## 2019-06-12 LAB — TSH: TSH: 1.56 u[IU]/mL (ref 0.450–4.500)

## 2019-06-12 MED ORDER — SERTRALINE HCL 100 MG PO TABS: 100.0000 mg | ORAL_TABLET | Freq: Every day | ORAL | 1 refills | Status: DC

## 2019-06-23 ENCOUNTER — Ambulatory Visit (INDEPENDENT_AMBULATORY_CARE_PROVIDER_SITE_OTHER): Payer: BC Managed Care – PPO | Admitting: Physician Assistant

## 2019-06-23 DIAGNOSIS — F419 Anxiety disorder, unspecified: Secondary | ICD-10-CM | POA: Diagnosis not present

## 2019-06-23 DIAGNOSIS — F329 Major depressive disorder, single episode, unspecified: Secondary | ICD-10-CM | POA: Diagnosis not present

## 2019-06-23 DIAGNOSIS — F32A Depression, unspecified: Secondary | ICD-10-CM

## 2019-06-23 MED ORDER — BUSPIRONE HCL 7.5 MG PO TABS: 7.5000 mg | ORAL_TABLET | Freq: Two times a day (BID) | ORAL | 0 refills | Status: DC

## 2019-06-23 NOTE — Progress Notes (Signed)
Patient: Rhonda Sweeney Female    DOB: 10/20/1990   29 y.o.   MRN: [id number] Visit Date: 06/23/2019  Today's Provider: Trey Sailors, PA-C   Chief Complaint  Patient presents with  . Anxiety  . Depression   Subjective:    Virtual Visit via Telephone Note  I connected with Rhonda Sweeney on 06/23/19 at  1:20 PM EDT by telephone and verified that I am speaking with the correct person using two identifiers.  Location: Patient: Home Provider: Office    I discussed the limitations, risks, security and privacy concerns of performing an evaluation and management service by telephone and the availability of in person appointments. I also discussed with the patient that there may be a patient responsible charge related to this service. The patient expressed understanding and agreed to proceed.  HPI  Anxiety and Depression Patient presents today for anxiety and depression follow-up. Patient last visit was virtually on 05/21/2019. She was starting to take zoloft 100 mg daily however she noticed increased headaches and vertigo. She has decreased to 75 mg QD and had improvement with headaches. She also had low sex drive and thus would like to discontinue anxiety medication completely.   GAD 7 : Generalized Anxiety Score 05/21/2019 02/28/2018  Nervous, Anxious, on Edge 0 3  Control/stop worrying 0 3  Worry too much - different things 2 3  Trouble relaxing 2 3  Restless 0 1  Easily annoyed or irritable 1 2  Afraid - awful might happen 0 1  Total GAD 7 Score 5 16  Anxiety Difficulty Somewhat difficult Very difficult   Depression screen [facility] 2/9 05/21/2019 03/28/2018 02/28/2018  Decreased Interest 0 1 2  Down, Depressed, Hopeless 0 0 2  PHQ - 2 Score 0 1 4  Altered sleeping 1 1 2   Tired, decreased energy 2 2 3   Change in appetite 0 2 3  Feeling bad or failure about yourself  0 0 0  Trouble concentrating 0 1 1  Moving slowly or fidgety/restless 0 0 0  Suicidal thoughts 0 0 0    PHQ-9 Score 3 7 13   Difficult doing work/chores Somewhat difficult Somewhat difficult Very difficult   Migraines: Started on topamax last time. Has not needed tylenol as much. Still having headaches but using PRN medicines much less. She is not sure if the increased headaches have been caused by increasing dose of zoloft.   No Known Allergies   Current Outpatient Medications:  .  famotidine (PEPCID) 20 MG tablet, Take 1 tablet (20 mg total) by mouth daily., Disp: 90 tablet, Rfl: 0 .  pantoprazole (PROTONIX) 20 MG tablet, Take 20 mg by mouth daily., Disp: , Rfl: 2 .  sertraline (ZOLOFT) 100 MG tablet, Take 1 tablet (100 mg total) by mouth daily., Disp: 90 tablet, Rfl: 1 .  topiramate (TOPAMAX) 50 MG tablet, Take 25 mg before bed x 1 week. Take 50 mg before bed x 1 wk. Then 75 mg before bed x 1 wk. Then 100 mg before bed., Disp: 180 tablet, Rfl: 0 .  ondansetron (ZOFRAN) 4 MG tablet, TAKE 1 TABLET BY MOUTH EVERY 8 HOURS AS NEEDED FOR NAUSEA AND VOMITING (Patient not taking: Reported on 05/21/2019), Disp: 20 tablet, Rfl: 0  Review of Systems  Constitutional: Negative.   Respiratory: Negative.   Genitourinary: Negative.   Neurological: Negative.     Social History   Tobacco Use  . Smoking status: Former  . Smokeless tobacco:  Current User  Substance Use Topics  . Alcohol use: Yes    Alcohol/week: 3.0 - 4.0 standard drinks    Types: 3 - 4 Glasses of wine per week      Objective:   There were no vitals taken for this visit. There were no vitals filed for this visit.There is no height or weight on file to calculate BMI.   Physical Exam   No results found for any visits on 06/23/19.     Assessment & Plan    1. Anxiety and depression  Advised on tapering zoloft 25 mg per week. May stay at a dose for longer than week if she has withdrawal symptoms. Will give buspar to take if after discontinuing zoloft her anxiety/depression rebounds. Will follow up in 3 months to  reassess mood and also headache status. May need to titrate topamax or switch to alternative medication.   - busPIRone (BUSPAR) 7.5 MG tablet; Take 1 tablet (7.5 mg total) by mouth 2 (two) times daily.  Dispense: 180 tablet; Refill: 0  I discussed the assessment and treatment plan with the patient. The patient was provided an opportunity to ask questions and all were answered. The patient agreed with the plan and demonstrated an understanding of the instructions.   The patient was advised to call back or seek an in-person evaluation if the symptoms worsen or if the condition fails to improve as anticipated.  I provided 25 minutes of non-face-to-face time during this encounter.  The entirety of the information documented in the History of Present Illness, Review of Systems and Physical Exam were personally obtained by me. Portions of this information were initially documented by [facility] and reviewed by me for thoroughness and accuracy.      Rhonda Post, PA-C  [facility]

## 2019-06-27 ENCOUNTER — Other Ambulatory Visit: Payer: Self-pay | Admitting: Physician Assistant

## 2019-06-27 DIAGNOSIS — R519 Headache, unspecified: Secondary | ICD-10-CM

## 2019-06-27 NOTE — Telephone Encounter (Signed)
Requested medication (s) are due for refill today: yes  Requested medication (s) are on the active medication list: yes  Last refill:  05/21/19  Future visit scheduled: no  Notes to clinic:  medication not delegated to refill   Requested Prescriptions  Pending Prescriptions Disp Refills   topiramate (TOPAMAX) 50 MG tablet [Pharmacy Med Name: TOPIRAMATE 50 MG TABLET] 180 tablet 0    Sig: 1TAB BY MOUTH BEFORE BED FOR 1WEEK, 2TABS BEFORE BED FOR 1WEEK, 3TABS BEFORE BED FOR 1WEEK, 4TABS BEFORE BED      Not Delegated - Neurology: Anticonvulsants - topiramate & zonisamide Failed - 06/27/2019  9:30 AM      Failed - This refill cannot be delegated      Failed - Cr in normal range and within 360 days    Creatinine, Ser  Date Value Ref Range Status  06/11/2019 1.06 (H) 0.57 - 1.00 mg/dL Final          Failed - CO2 in normal range and within 360 days    CO2  Date Value Ref Range Status  06/11/2019 18 (L) 20 - 29 mmol/L Final          Passed - Valid encounter within last 12 months    Recent Outpatient Visits           4 days ago Anxiety and depression   [facility] [place], [place], PA-C   1 month ago Anxiety and depression   [facility] [address] [place], [place], [place]   1 year ago Anxiety and depression   [facility] [place], Lavella [place], [place]   1 year ago Anxiety and depression   [facility] [place], [place], [place]

## 2019-06-29 NOTE — Telephone Encounter (Signed)
LOV  06/23/19  LRF  05/21/19   #180  X 0

## 2019-06-30 MED ORDER — TOPIRAMATE 100 MG PO TABS: 100.0000 mg | ORAL_TABLET | Freq: Two times a day (BID) | ORAL | 0 refills | Status: DC

## 2019-06-30 NOTE — Telephone Encounter (Signed)
Topamax 100 mg daily #90 sent in.

## 2019-07-20 DIAGNOSIS — R070 Pain in throat: Secondary | ICD-10-CM | POA: Diagnosis not present

## 2019-07-20 DIAGNOSIS — J020 Streptococcal pharyngitis: Secondary | ICD-10-CM | POA: Diagnosis not present

## 2019-08-12 ENCOUNTER — Other Ambulatory Visit: Payer: Self-pay | Admitting: Physician Assistant

## 2019-08-12 DIAGNOSIS — F419 Anxiety disorder, unspecified: Secondary | ICD-10-CM

## 2019-08-12 DIAGNOSIS — F32A Depression, unspecified: Secondary | ICD-10-CM

## 2019-08-12 DIAGNOSIS — K219 Gastro-esophageal reflux disease without esophagitis: Secondary | ICD-10-CM

## 2019-08-12 NOTE — Telephone Encounter (Signed)
Requested Prescriptions  Pending Prescriptions Disp Refills  . sertraline (ZOLOFT) 50 MG tablet [Pharmacy Med Name: SERTRALINE HCL 50 MG TABLET] 90 tablet 0    Sig: TAKE 1 TABLET BY MOUTH EVERY DAY     Psychiatry:  Antidepressants - SSRI Passed - 08/12/2019  1:21 AM      Passed - Completed PHQ-2 or PHQ-9 in the last 360 days.      Passed - Valid encounter within last 6 months    Recent Outpatient Visits          1 month ago Anxiety and depression   [facility] [place], [place]   2 months ago Anxiety and depression   [facility] [place], [place], [place]   1 year ago Anxiety and depression   [facility] [place], [place]   1 year ago Anxiety and depression   [facility] [place], Adriana M, PA-C             . famotidine (PEPCID) 20 MG tablet [Pharmacy Med Name: FAMOTIDINE 20 MG TABLET] 90 tablet 2    Sig: TAKE 1 TABLET BY MOUTH EVERY DAY     Gastroenterology:  H2 Antagonists Passed - 08/12/2019  1:21 AM      Passed - Valid encounter within last 12 months    Recent Outpatient Visits          1 month ago Anxiety and depression   [facility] [place], [place], [place]   2 months ago Anxiety and depression   [facility] [place], [place], [place]   1 year ago Anxiety and depression   [facility] [place], [place]ella [place], [place]   1 year ago Anxiety and depression   [facility] [place], [place], [place]

## 2019-09-03 ENCOUNTER — Other Ambulatory Visit: Payer: Self-pay | Admitting: Physician Assistant

## 2019-09-03 DIAGNOSIS — F419 Anxiety disorder, unspecified: Secondary | ICD-10-CM

## 2019-09-03 DIAGNOSIS — F32A Depression, unspecified: Secondary | ICD-10-CM

## 2019-09-03 DIAGNOSIS — R519 Headache, unspecified: Secondary | ICD-10-CM

## 2019-09-03 NOTE — Telephone Encounter (Signed)
Requested medication (s) are due for refill today: Already filled today  Requested medication (s) are on the active medication list: Yes  Last refill:  09/03/19  Future visit scheduled: Yes  Notes to clinic:  Filled today, but pharmacy asking for 90 day supply.    Requested Prescriptions  Pending Prescriptions Disp Refills   busPIRone (BUSPAR) 7.5 MG tablet [Pharmacy Med Name: BUSPIRONE HCL 7.5 MG TABLET] 180 tablet 1    Sig: TAKE 1 TABLET (7.5 MG TOTAL) BY MOUTH 2 (TWO) TIMES DAILY.      Psychiatry: Anxiolytics/Hypnotics - Non-controlled Passed - 09/03/2019  3:19 PM      Passed - Valid encounter within last 6 months    Recent Outpatient Visits           2 months ago Anxiety and depression   [facility] Trey Sailors, [place]   3 months ago Anxiety and depression   [facility] [place], [place], [place]   1 year ago Anxiety and depression   [facility] [place], [place]ella [place], [place]   1 year ago Anxiety and depression   [facility] [place], [place], [place]       Future Appointments             Tomorrow Trey Sailors, PA-C [facility]

## 2019-09-03 NOTE — Telephone Encounter (Signed)
Attempted to call patient for follow up appointment- left message on VM to call office to schedule. Courtesy #[phone] day RF given

## 2019-09-03 NOTE — Telephone Encounter (Signed)
Requested medication (s) are due for refill today - yes  Requested medication (s) are on the active medication list -yes  Future visit scheduled -no  Last refill: 05/21/19  Notes to clinic: Request RF of non delegated Rx  Requested Prescriptions  Pending Prescriptions Disp Refills   topiramate (TOPAMAX) 50 MG tablet [Pharmacy Med Name: TOPIRAMATE 50 MG TABLET] 180 tablet 0    Sig: 1TAB BY MOUTH BEFORE BED FOR 1WEEK, 2TABS BEFORE BED FOR 1WEEK, 3TABS BEFORE BED FOR 1WEEK, 4TABS BEFORE BED      Not Delegated - Neurology: Anticonvulsants - topiramate & zonisamide Failed - 09/03/2019 12:23 PM      Failed - This refill cannot be delegated      Failed - Cr in normal range and within 360 days    Creatinine, Ser  Date Value Ref Range Status  06/11/2019 1.06 (H) 0.57 - 1.00 mg/dL Final          Failed - CO2 in normal range and within 360 days    CO2  Date Value Ref Range Status  06/11/2019 18 (L) 20 - 29 mmol/L Final          Passed - Valid encounter within last 12 months    Recent Outpatient Visits           2 months ago Anxiety and depression   [facility] [place], Adriana M, PA-C   3 months ago Anxiety and depression   [facility] [place], [place], [place]   1 year ago Anxiety and depression   [facility] [place] Angst [place], [place]   1 year ago Anxiety and depression   [facility] [place], Adriana M, [place]               Signed Prescriptions Disp Refills   busPIRone (BUSPAR) 7.5 MG tablet 60 tablet 0    Sig: TAKE 1 TABLET (7.5 MG TOTAL) BY MOUTH 2 (TWO) TIMES DAILY.      Psychiatry: Anxiolytics/Hypnotics - Non-controlled Passed - 09/03/2019 12:23 PM      Passed - Valid encounter within last 6 months    Recent Outpatient Visits           2 months ago Anxiety and depression   [facility] Osvaldo Angst M, PA-C   3 months ago Anxiety and depression   [facility] Os[place]do Angst M,  [place]   1 year ago Anxiety and depression   [facility] Os[place]do Angst M, [place]   1 year ago Anxiety and depression   [facility] Os[place]do Angst M, [place]                  Requested Prescriptions  Pending Prescriptions Disp Refills   topiramate (TOPAMAX) 50 MG tablet [Pharmacy Med Name: TOPIRAMATE 50 MG TABLET] 180 tablet 0    Sig: 1TAB BY MOUTH BEFORE BED FOR 1WEEK, 2TABS BEFORE BED FOR 1WEEK, 3TABS BEFORE BED FOR 1WEEK, 4TABS BEFORE BED      Not Delegated - Neurology: Anticonvulsants - topiramate & zonisamide Failed - 09/03/2019 12:23 PM      Failed - This refill cannot be delegated      Failed - Cr in normal range and within 360 days    Creatinine, Ser  Date Value Ref Range Status  06/11/2019 1.06 (H) 0.57 - 1.00 mg/dL Final          Failed - CO2 in normal range and within 360 days    CO2  Date Value Ref Range Status  06/11/2019 18 (L) 20 - 29 mmol/L Final          Passed - Valid encounter within last 12 months    Recent Outpatient Visits           2 months ago Anxiety and depression   [facility] Car[place] Collet M, PA-C   3 months ago Anxiety and depression   [facility] [place], [place], [place]   1 year ago Anxiety and depression   [facility] Carles Collet [place], [place]   1 year ago Anxiety and depression   [facility] Carles Collet [place], [place]               Signed Prescriptions Disp Refills   busPIRone (BUSPAR) 7.5 MG tablet 60 tablet 0    Sig: TAKE 1 TABLET (7.5 MG TOTAL) BY MOUTH 2 (TWO) TIMES DAILY.      Psychiatry: Anxiolytics/Hypnotics - Non-controlled Passed - 09/03/2019 12:23 PM      Passed - Valid encounter within last 6 months    Recent Outpatient Visits           2 months ago Anxiety and depression   [place], [place], [place]   3 months ago Anxiety and depression   [facility] [place], Wendee [place], [place]   1 year ago  Anxiety and depression   [facility] [employer], [place], [place]   1 year ago Anxiety and depression   [facility] Carles Collet [place], [place]

## 2019-09-04 ENCOUNTER — Ambulatory Visit: Payer: BC Managed Care – PPO | Admitting: Physician Assistant

## 2019-09-04 ENCOUNTER — Telehealth: Payer: BC Managed Care – PPO | Admitting: Physician Assistant

## 2019-09-14 NOTE — Progress Notes (Signed)
MyChart Video Visit    Virtual Visit via Video Note   This visit type was conducted due to national recommendations for restrictions regarding the COVID-19 Pandemic (e.g. social distancing) in an effort to limit this patient's exposure and mitigate transmission in our community. This patient is at least at moderate risk for complications without adequate follow up. This format is felt to be most appropriate for this patient at this time. Physical exam was limited by quality of the video and audio technology used for the visit.   Patient location: Home Provider location: Office    Patient: Rhonda Sweeney   DOB: 11/04/1990   29 y.o. Female  MRN: [id number] Visit Date: 09/15/2019  Today's healthcare provider: Trey Sailors, PA-C   Chief Complaint  Patient presents with  . Anxiety  . Depression  I,Porsha C McClurkin,acting as a scribe for Trey Sailors, PA-C.,have documented all relevant documentation on the behalf of Trey Sailors, PA-C,as directed by  Trey Sailors, PA-C while in the presence of Trey Sailors, PA-C.  Subjective    HPI Anxiety, Follow-up  She was last seen for anxiety 3 months ago. Changes made at last visit include patient was advised on tapering zoloft 25 mg per week. May stay at a dose for longer than week if she has withdrawal symptoms. Will give buspar to take if after discontinuing zoloft her anxiety/depression rebounds. Will follow up in 3 months to reassess mood and also headache status. May need to titrate topamax or switch to alternative medication.     She reports good compliance with treatment. Patient reports she has completely stopped taking her medication. She reports fair tolerance of treatment. She is not having side effects.   She feels her anxiety is mild and Improved since last visit.  Symptoms: No chest pain No difficulty concentrating  No dizziness No fatigue  No feelings of losing control No insomnia  No irritable No  palpitations  No panic attacks No racing thoughts  No shortness of breath No sweating  No tremors/shakes    GAD-7 Results GAD-7 Generalized Anxiety Disorder Screening Tool 09/15/2019 05/21/2019 02/28/2018  1. Feeling Nervous, Anxious, or on Edge 1 0 3  2. Not Being Able to Stop or Control Worrying 0 0 3  3. Worrying Too Much About Different Things 1 2 3   4. Trouble Relaxing 2 2 3   5. Being So Restless it's Hard To Sit Still 1 0 1  6. Becoming Easily Annoyed or Irritable 1 1 2   7. Feeling Afraid As If Something Awful Might Happen 0 0 1  Total GAD-7 Score 6 5 16   Difficulty At Work, Home, or Getting  Along With Others? Somewhat difficult Somewhat difficult Very difficult   --------------------------------------------------------------------------------------------------- Depression, Follow-up  She  was last seen for this 3 months ago. Changes made at last visit include patient was advised on tapering zoloft 25 mg per week. May stay at a dose for longer than week if she has withdrawal symptoms. Will give buspar to take if after discontinuing zoloft her anxiety/depression rebounds. Will follow up in 3 months to reassess mood and also headache status. May need to titrate topamax or switch to alternative medication.    She reports good compliance with treatment. She is not having side effects.    Current symptoms include: fatigue She feels she is Improved since last visit.  Depression screen [facility] 2/9 09/15/2019 05/21/2019 03/28/2018  Decreased Interest 0 0 1  Down, Depressed, Hopeless 0  0 0  PHQ - 2 Score 0 0 1  Altered sleeping 0 1 1  Tired, decreased energy 2 2 2   Change in appetite 2 0 2  Feeling bad or failure about yourself  0 0 0  Trouble concentrating 0 0 1  Moving slowly or fidgety/restless 0 0 0  Suicidal thoughts 0 0 0  PHQ-9 Score 4 3 7   Difficult doing work/chores Somewhat difficult Somewhat difficult Somewhat difficult    BP Readings from Last 3 Encounters:  03/28/18  122/82  11/29/17 138/90  06/14/17 120/70   Headaches: Currently taking topamax. Has stopped zoloft. Has failed magnesium oxide due to diarrhea and B2 due to a side effect the patient cannot recall. She would like to see if she still has headaches without the Topamax.   --------------------------------------------------------------------------------------------------- -----------------------------------------------------------------------------------------     Medications: Outpatient Medications Prior to Visit  Medication Sig  . famotidine (PEPCID) 20 MG tablet TAKE 1 TABLET BY MOUTH EVERY DAY  . pantoprazole (PROTONIX) 20 MG tablet Take 20 mg by mouth daily.  01/29/18 topiramate (TOPAMAX) 50 MG tablet 1TAB BY MOUTH BEFORE BED FOR 1WEEK, 2TABS BEFORE BED FOR 1WEEK, 3TABS BEFORE BED FOR 1WEEK, 4TABS BEFORE BED  . busPIRone (BUSPAR) 7.5 MG tablet TAKE 1 TABLET (7.5 MG TOTAL) BY MOUTH 2 (TWO) TIMES DAILY. (Patient not taking: Reported on 09/15/2019)  . ondansetron (ZOFRAN) 4 MG tablet TAKE 1 TABLET BY MOUTH EVERY 8 HOURS AS NEEDED FOR NAUSEA AND VOMITING (Patient not taking: Reported on 05/21/2019)  . sertraline (ZOLOFT) 100 MG tablet Take 1 tablet (100 mg total) by mouth daily. (Patient not taking: Reported on 09/15/2019)   No facility-administered medications prior to visit.    Review of Systems  Constitutional: Negative.   Respiratory: Negative.   Neurological: Negative.   Psychiatric/Behavioral: Negative.       Objective    There were no vitals taken for this visit.   Physical Exam Constitutional:      Appearance: Normal appearance.  Pulmonary:     Effort: Pulmonary effort is normal. No respiratory distress.  Neurological:     Mental Status: She is alert and oriented to person, place, and time. Mental status is at baseline.  Psychiatric:        Mood and Affect: Mood normal.        Behavior: Behavior normal.        Assessment & Plan    1. Anxiety and depression  She is  currently off zoloft and did not start Buspar. She would like to continue without medication currently. Counseled on cyclical nature or anxiety and depression and possible need for treatment in the future.   2. Chronic nonintractable headache, unspecified headache type  Counseled she may try tapering topamax if she would like and observing for increased headaches. She can take 50 mg QHS for one week, then 25 mg QHS for one week and then she may stop. If her headaches increase, can resume the medication. If not tolerated, can consider propranolol.     No follow-ups on file.     I discussed the assessment and treatment plan with the patient. The patient was provided an opportunity to ask questions and all were answered. The patient agreed with the plan and demonstrated an understanding of the instructions.   The patient was advised to call back or seek an in-person evaluation if the symptoms worsen or if the condition fails to improve as anticipated.   I, 05/23/2019, PA-C, have reviewed all  documentation for this visit. The documentation on 09/16/19 for the exam, diagnosis, procedures, and orders are all accurate and complete.   Paulene Floor [facility] [phone] (phone) [phone] (fax)  [place]

## 2019-09-15 ENCOUNTER — Telehealth (INDEPENDENT_AMBULATORY_CARE_PROVIDER_SITE_OTHER): Payer: BC Managed Care – PPO | Admitting: Physician Assistant

## 2019-09-15 DIAGNOSIS — F329 Major depressive disorder, single episode, unspecified: Secondary | ICD-10-CM

## 2019-09-15 DIAGNOSIS — R519 Headache, unspecified: Secondary | ICD-10-CM

## 2019-09-15 DIAGNOSIS — G8929 Other chronic pain: Secondary | ICD-10-CM

## 2019-09-15 DIAGNOSIS — F419 Anxiety disorder, unspecified: Secondary | ICD-10-CM | POA: Diagnosis not present

## 2019-09-15 DIAGNOSIS — F32A Depression, unspecified: Secondary | ICD-10-CM

## 2019-09-18 ENCOUNTER — Other Ambulatory Visit: Payer: Self-pay | Admitting: Physician Assistant

## 2019-09-18 DIAGNOSIS — R519 Headache, unspecified: Secondary | ICD-10-CM

## 2019-11-26 ENCOUNTER — Other Ambulatory Visit: Payer: Self-pay | Admitting: Physician Assistant

## 2019-11-26 DIAGNOSIS — R519 Headache, unspecified: Secondary | ICD-10-CM

## 2019-11-26 NOTE — Telephone Encounter (Signed)
Requested medication (s) are due for refill today: no  Requested medication (s) are on the active medication list: yes   Last refill:  start: 06/30/19 end: 09/04/19 #180 0 refills  Future visit scheduled: no  Notes to clinic:  not delegated per protocol, D/C 09/04/19 by [place],PA. Do you want to renew? Dx code needed     Requested Prescriptions  Pending Prescriptions Disp Refills   topiramate (TOPAMAX) 100 MG tablet [Pharmacy Med Name: TOPIRAMATE 100 MG TABLET] 180 tablet 0    Sig: TAKE 1 TABLET BY MOUTH TWICE A DAY      Not Delegated - Neurology: Anticonvulsants - topiramate & zonisamide Failed - 11/26/2019 12:30 PM      Failed - This refill cannot be delegated      Failed - Cr in normal range and within 360 days    Creatinine, Ser  Date Value Ref Range Status  06/11/2019 1.06 (H) 0.57 - 1.00 mg/dL Final          Failed - CO2 in normal range and within 360 days    CO2  Date Value Ref Range Status  06/11/2019 18 (L) 20 - 29 mmol/L Final          Passed - Valid encounter within last 12 months    Recent Outpatient Visits           2 months ago Anxiety and depression   [facility] [place], [place], PA-C   5 months ago Anxiety and depression   [facility] [place], [place], [place]   6 months ago Anxiety and depression   [facility] [place], [place], [place]   1 year ago Anxiety and depression   [facility] [place], [place], [place]   1 year ago Anxiety and depression   [facility] [place], [place], [place]

## 2020-01-28 DIAGNOSIS — J069 Acute upper respiratory infection, unspecified: Secondary | ICD-10-CM | POA: Diagnosis not present

## 2020-01-28 DIAGNOSIS — Z1152 Encounter for screening for COVID-19: Secondary | ICD-10-CM | POA: Diagnosis not present

## 2020-01-28 DIAGNOSIS — J029 Acute pharyngitis, unspecified: Secondary | ICD-10-CM | POA: Diagnosis not present

## 2020-03-25 ENCOUNTER — Other Ambulatory Visit: Payer: Self-pay | Admitting: Physician Assistant

## 2020-03-25 DIAGNOSIS — R519 Headache, unspecified: Secondary | ICD-10-CM

## 2020-03-25 NOTE — Telephone Encounter (Signed)
Requested medications are due for refill today yes  Requested medications are on the active medication list yes  Last refill 10/6  Last visit 08/2019  Future visit scheduled no  Notes to clinic Not Delegated

## 2020-04-05 DIAGNOSIS — F411 Generalized anxiety disorder: Secondary | ICD-10-CM | POA: Diagnosis not present

## 2020-04-20 DIAGNOSIS — Z20822 Contact with and (suspected) exposure to covid-19: Secondary | ICD-10-CM | POA: Diagnosis not present

## 2020-04-20 DIAGNOSIS — J069 Acute upper respiratory infection, unspecified: Secondary | ICD-10-CM | POA: Diagnosis not present

## 2020-04-22 DIAGNOSIS — U071 COVID-19: Secondary | ICD-10-CM | POA: Diagnosis not present

## 2020-08-16 ENCOUNTER — Other Ambulatory Visit: Payer: Self-pay

## 2020-08-16 ENCOUNTER — Encounter: Payer: Self-pay | Admitting: Advanced Practice Midwife

## 2020-08-16 ENCOUNTER — Other Ambulatory Visit (HOSPITAL_COMMUNITY)
Admission: RE | Admit: 2020-08-16 | Discharge: 2020-08-16 | Disposition: A | Discharge status: Home / Self Care | Payer: BC Managed Care – PPO | Source: Ambulatory Visit | Attending: Advanced Practice Midwife | Admitting: Advanced Practice Midwife

## 2020-08-16 ENCOUNTER — Ambulatory Visit (INDEPENDENT_AMBULATORY_CARE_PROVIDER_SITE_OTHER): Payer: BC Managed Care – PPO | Admitting: Advanced Practice Midwife

## 2020-08-16 VITALS — BP 102/60 | Ht 63.0 in | Wt 122.0 lb

## 2020-08-16 DIAGNOSIS — N6312 Unspecified lump in the right breast, upper inner quadrant: Secondary | ICD-10-CM

## 2020-08-16 DIAGNOSIS — Z124 Encounter for screening for malignant neoplasm of cervix: Secondary | ICD-10-CM | POA: Diagnosis not present

## 2020-08-16 DIAGNOSIS — Z01419 Encounter for gynecological examination (general) (routine) without abnormal findings: Secondary | ICD-10-CM

## 2020-08-16 NOTE — Progress Notes (Signed)
Gynecology Annual Exam   PCP: Trey Sailors, PA-C  Chief Complaint:  Chief Complaint  Patient presents with  . Annual Exam    Right breast lump    History of Present Illness: Patient is a 30 y.o. G2P1001 presents for annual exam. The patient has complaint today of right breast lump that she has noticed for the past 4-5 months. The lump is slightly tender and has not changed in size over time.   LMP: Patient's last menstrual period was 07/29/2020. Average Interval: regular, 28 days Duration of flow: 5 days Heavy Menses: first 2 days Clots: no Intermenstrual Bleeding: no Postcoital Bleeding: no Dysmenorrhea: first 2 days  The patient is sexually active. She currently uses none for contraception. She denies dyspareunia.  The patient does perform self breast exams.  There is no notable family history of breast or ovarian cancer in her family.  The patient wears seatbelts: yes.   The patient has regular exercise: she works out at the gym 3-4 days per week and is active with her children, she admits not eating veggies or fruits regularly, she admits adequate hydration and about 6 hours sleep per night.    The patient denies current symptoms of depression. She reports doing well on buspirone.  Review of Systems: Review of Systems  Constitutional: Negative for chills and fever.  HENT: Negative for congestion, ear discharge, ear pain, hearing loss, sinus pain and sore throat.   Eyes: Negative for blurred vision and double vision.  Respiratory: Negative for cough, shortness of breath and wheezing.   Cardiovascular: Negative for chest pain, palpitations and leg swelling.  Gastrointestinal: Negative for abdominal pain, blood in stool, constipation, diarrhea, heartburn, melena, nausea and vomiting.  Genitourinary: Negative for dysuria, flank pain, frequency, hematuria and urgency.  Musculoskeletal: Negative for back pain, joint pain and myalgias.  Skin: Negative for itching and  rash.  Neurological: Negative for dizziness, tingling, tremors, sensory change, speech change, focal weakness, seizures, loss of consciousness, weakness and headaches.  Endo/Heme/Allergies: Negative for environmental allergies. Does not bruise/bleed easily.  Psychiatric/Behavioral: Negative for depression, hallucinations, memory loss, substance abuse and suicidal ideas. The patient is not nervous/anxious and does not have insomnia.   Breast: positive for right breast lump  Past Medical History:  Patient Active Problem List   Diagnosis Date Noted  . Anxiety and depression 04/04/2018    Past Surgical History:  Past Surgical History:  Procedure Laterality Date  . NO PAST SURGERIES      Gynecologic History:  Patient's last menstrual period was 07/29/2020. Contraception: none Last Pap: 4 years ago Results were: no abnormalities   Obstetric History: G2P1001  Family History:  Family History  Problem Relation Age of Onset  . Diabetes Maternal Grandmother   . Breast cancer Maternal Grandmother 30  . Uterine cancer Mother 28  . Cancer Neg Hx     Social History:  Social History   Socioeconomic History  . Marital status: Married    Spouse name: Not on file  . Number of children: Not on file  . Years of education: Not on file  . Highest education level: Not on file  Occupational History  . Not on file  Tobacco Use  . Smoking status: Former Games developer  . Smokeless tobacco: Current User  Substance and Sexual Activity  . Alcohol use: Yes    Alcohol/week: 3.0 - 4.0 standard drinks    Types: 3 - 4 Glasses of wine per week  . Drug use: No  .  Sexual activity: Yes    Birth control/protection: None  Other Topics Concern  . Not on file  Social History Narrative  . Not on file   Social Determinants of Health   Financial Resource Strain: Not on file  Food Insecurity: Not on file  Transportation Needs: Not on file  Physical Activity: Not on file  Stress: Not on file  Social  Connections: Not on file  Intimate Partner Violence: Not on file    Allergies:  No Known Allergies  Medications: Prior to Admission medications   Medication Sig Start Date End Date Taking? Authorizing Provider  busPIRone (BUSPAR) 7.5 MG tablet Take 7.5 mg by mouth 2 (two) times daily. 08/01/20  Yes [provider]  topiramate (TOPAMAX) 100 MG tablet TAKE 1 TABLET BY MOUTH TWICE A DAY 03/31/20  Yes Trey Sailors, PA-C    Physical Exam Vitals: Blood pressure 102/60, height 5\' 3"  (1.6 m), weight 122 lb (55.3 kg), last menstrual period 07/29/2020  General: NAD HEENT: normocephalic, anicteric Thyroid: no enlargement, no palpable nodules Pulmonary: No increased work of breathing, CTAB Cardiovascular: RRR, distal pulses 2+ Breast: Left breast with fibrocystic breast tissue, Right breast with similar tissue, 1cm lump at 1 o'clock adjacent to areola, no skin or nipple retraction present, no nipple discharge.  No axillary or supraclavicular lymphadenopathy. Abdomen: NABS, soft, non-tender, non-distended.  Umbilicus without lesions.  No hepatomegaly, splenomegaly or masses palpable. No evidence of hernia  Genitourinary:  External: Normal external female genitalia.  Normal urethral meatus, normal Bartholin's and Skene's glands.    Vagina: Normal vaginal mucosa, no evidence of prolapse.    Cervix: Grossly normal in appearance, previous cervical laceration site noted at 6 o'clock, no bleeding  Uterus: Non-enlarged, mobile, normal contour.  No CMT  Adnexa: ovaries non-enlarged, no adnexal masses  Rectal: deferred  Lymphatic: no evidence of inguinal lymphadenopathy Extremities: no edema, erythema, or tenderness Neurologic: Grossly intact Psychiatric: mood appropriate, affect full    Assessment: 30 y.o. G2P1001 routine annual exam  Plan: Problem List Items Addressed This Visit   None   Visit Diagnoses    Well woman exam with routine gynecological exam    -  Primary   Relevant  Orders   Cytology - PAP   MM DIAG BREAST TOMO BILATERAL   26 BREAST LTD UNI RIGHT INC AXILLA   Screening for cervical cancer       Relevant Orders   Cytology - PAP   Breast lump on right side at 1 o'clock position       Relevant Orders   MM DIAG BREAST TOMO BILATERAL   US BREAST LTD UNI RIGHT INC AXILLA      1) STI screening  was offered and declined  2)  ASCCP guidelines and rationale discussed.  Patient opts for every 3 years screening interval  3) Contraception - the patient is currently using  none.  She is happy with her current form of contraception and plans to continue  4) Routine healthcare maintenance including cholesterol, diabetes screening discussed Declines  5) Return in about 1 year (around 08/16/2021) for annual established gyn.   08/18/2021, CNM [facility] 08/16/2020, 11:35 AM

## 2020-08-16 NOTE — Patient Instructions (Signed)
Health Maintenance, Female Adopting a healthy lifestyle and getting preventive care are important in promoting health and wellness. Ask your health care provider about:  The right schedule for you to have regular tests and exams.  Things you can do on your own to prevent diseases and keep yourself healthy. What should I know about diet, weight, and exercise? Eat a healthy diet  Eat a diet that includes plenty of vegetables, fruits, low-fat dairy products, and lean protein.  Do not eat a lot of foods that are high in solid fats, added sugars, or sodium.   Maintain a healthy weight Body mass index (BMI) is used to identify weight problems. It estimates body fat based on height and weight. Your health care provider can help determine your BMI and help you achieve or maintain a healthy weight. Get regular exercise Get regular exercise. This is one of the most important things you can do for your health. Most adults should:  Exercise for at least 150 minutes each week. The exercise should increase your heart rate and make you sweat (moderate-intensity exercise).  Do strengthening exercises at least twice a week. This is in addition to the moderate-intensity exercise.  Spend less time sitting. Even light physical activity can be beneficial. Watch cholesterol and blood lipids Have your blood tested for lipids and cholesterol at 29 years of age, then have this test every 5 years. Have your cholesterol levels checked more often if:  Your lipid or cholesterol levels are high.  You are older than 29 years of age.  You are at high risk for heart disease. What should I know about cancer screening? Depending on your health history and family history, you may need to have cancer screening at various ages. This may include screening for:  Breast cancer.  Cervical cancer.  Colorectal cancer.  Skin cancer.  Lung cancer. What should I know about heart disease, diabetes, and high blood  pressure? Blood pressure and heart disease  High blood pressure causes heart disease and increases the risk of stroke. This is more likely to develop in people who have high blood pressure readings, are of African descent, or are overweight.  Have your blood pressure checked: ? Every 3-5 years if you are 18-39 years of age. ? Every year if you are 40 years old or older. Diabetes Have regular diabetes screenings. This checks your fasting blood sugar level. Have the screening done:  Once every three years after age 40 if you are at a normal weight and have a low risk for diabetes.  More often and at a younger age if you are overweight or have a high risk for diabetes. What should I know about preventing infection? Hepatitis B If you have a higher risk for hepatitis B, you should be screened for this virus. Talk with your health care provider to find out if you are at risk for hepatitis B infection. Hepatitis C Testing is recommended for:  Everyone born from 1945 through 1965.  Anyone with known risk factors for hepatitis C. Sexually transmitted infections (STIs)  Get screened for STIs, including gonorrhea and chlamydia, if: ? You are sexually active and are younger than 29 years of age. ? You are older than 29 years of age and your health care provider tells you that you are at risk for this type of infection. ? Your sexual activity has changed since you were last screened, and you are at increased risk for chlamydia or gonorrhea. Ask your health care provider   if you are at risk.  Ask your health care provider about whether you are at high risk for HIV. Your health care provider may recommend a prescription medicine to help prevent HIV infection. If you choose to take medicine to prevent HIV, you should first get tested for HIV. You should then be tested every 3 months for as long as you are taking the medicine. Pregnancy  If you are about to stop having your period (premenopausal) and  you may become pregnant, seek counseling before you get pregnant.  Take 400 to 800 micrograms (mcg) of folic acid every day if you become pregnant.  Ask for birth control (contraception) if you want to prevent pregnancy. Osteoporosis and menopause Osteoporosis is a disease in which the bones lose minerals and strength with aging. This can result in bone fractures. If you are 65 years old or older, or if you are at risk for osteoporosis and fractures, ask your health care provider if you should:  Be screened for bone loss.  Take a calcium or vitamin D supplement to lower your risk of fractures.  Be given hormone replacement therapy (HRT) to treat symptoms of menopause. Follow these instructions at home: Lifestyle  Do not use any products that contain nicotine or tobacco, such as cigarettes, e-cigarettes, and chewing tobacco. If you need help quitting, ask your health care provider.  Do not use street drugs.  Do not share needles.  Ask your health care provider for help if you need support or information about quitting drugs. Alcohol use  Do not drink alcohol if: ? Your health care provider tells you not to drink. ? You are pregnant, may be pregnant, or are planning to become pregnant.  If you drink alcohol: ? Limit how much you use to 0-1 drink a day. ? Limit intake if you are breastfeeding.  Be aware of how much alcohol is in your drink. In the [place] one drink equals one 12 oz bottle of beer (355 mL), one 5 oz glass of wine (148 mL), or one 1 oz glass of hard liquor (44 mL). General instructions  Schedule regular health, dental, and eye exams.  Stay current with your vaccines.  Tell your health care provider if: ? You often feel depressed. ? You have ever been abused or do not feel safe at home. Summary  Adopting a healthy lifestyle and getting preventive care are important in promoting health and wellness.  Follow your health care provider's instructions about healthy  diet, exercising, and getting tested or screened for diseases.  Follow your health care provider's instructions on monitoring your cholesterol and blood pressure. This information is not intended to replace advice given to you by your health care provider. Make sure you discuss any questions you have with your health care provider. Document Revised: 03/05/2018 Document Reviewed: 03/05/2018 Elsevier Patient Education  2021 Elsevier Inc.  

## 2020-08-17 LAB — CYTOLOGY - PAP: Diagnosis: NEGATIVE

## 2020-08-23 ENCOUNTER — Ambulatory Visit
Admission: RE | Admit: 2020-08-23 | Discharge: 2020-08-23 | Disposition: A | Discharge status: Home / Self Care | Payer: BC Managed Care – PPO | Source: Ambulatory Visit | Attending: Advanced Practice Midwife | Admitting: Advanced Practice Midwife

## 2020-08-23 ENCOUNTER — Other Ambulatory Visit: Payer: Self-pay

## 2020-08-23 DIAGNOSIS — Z01419 Encounter for gynecological examination (general) (routine) without abnormal findings: Secondary | ICD-10-CM | POA: Insufficient documentation

## 2020-08-23 DIAGNOSIS — N6313 Unspecified lump in the right breast, lower outer quadrant: Secondary | ICD-10-CM | POA: Diagnosis not present

## 2020-08-23 DIAGNOSIS — N6312 Unspecified lump in the right breast, upper inner quadrant: Secondary | ICD-10-CM | POA: Diagnosis not present

## 2020-10-13 ENCOUNTER — Telehealth: Payer: Self-pay

## 2020-10-13 NOTE — Telephone Encounter (Signed)
Copied from CRM [id number]. Topic: Appointment Scheduling - Scheduling Inquiry for Clinic >> Oct 13, 2020 10:12 AM Leafy Ro wrote: Reason for CRM: Pt is calling and would like an appt with new provider elisa for exhaustion and to check her vit d level and tsh

## 2020-10-24 ENCOUNTER — Other Ambulatory Visit: Payer: Self-pay

## 2020-10-24 ENCOUNTER — Ambulatory Visit: Payer: BC Managed Care – PPO | Admitting: Family Medicine

## 2020-10-24 ENCOUNTER — Encounter: Payer: Self-pay | Admitting: Family Medicine

## 2020-10-24 VITALS — BP 110/74 | HR 74 | Temp 98.6°F | Wt 123.0 lb

## 2020-10-24 DIAGNOSIS — J302 Other seasonal allergic rhinitis: Secondary | ICD-10-CM

## 2020-10-24 DIAGNOSIS — R5382 Chronic fatigue, unspecified: Secondary | ICD-10-CM

## 2020-10-24 NOTE — Progress Notes (Signed)
Acute Office Visit  Subjective:    Patient ID: Rhonda Sweeney, female    DOB: 1991-03-22, 30 y.o.   MRN: [id number]  No chief complaint on file.   HPI Patient is in today for fatigue.  Patient states she is always tired regardless of the amount of sleep.  She states that when she gets up she does not feel like she has rested.  She has been experiencing this for 6-12 months/  She does admit that her husband states she snores at night and feels she does get into a deep sleep.  She works full time and is the mother of a 54 and 67 year old.   She reports that she does have allergies and feels she keeps a runny niose 365 days a year.   Past Medical History:  Diagnosis Date   Anxiety    Depression    Family history of breast cancer 02/2017   genetic testing letter sent   GERD (gastroesophageal reflux disease)    No known health problems     Past Surgical History:  Procedure Laterality Date   NO PAST SURGERIES      Family History  Problem Relation Age of Onset   Diabetes Maternal Grandmother    Breast cancer Maternal Grandmother 30   Uterine cancer Mother 77   Cancer Neg Hx     Social History   Socioeconomic History   Marital status: Married    Spouse name: Not on file   Number of children: Not on file   Years of education: Not on file   Highest education level: Not on file  Occupational History   Not on file  Tobacco Use   Smoking status: Former   Smokeless tobacco: Current  Substance and Sexual Activity   Alcohol use: Yes    Alcohol/week: 3.0 - 4.0 standard drinks    Types: 3 - 4 Glasses of wine per week   Drug use: No   Sexual activity: Yes    Birth control/protection: None  Other Topics Concern   Not on file  Social History Narrative   Not on file   Social Determinants of Health   Financial Resource Strain: Not on file  Food Insecurity: Not on file  Transportation Needs: Not on file  Physical Activity: Not on file  Stress: Not on file  Social  Connections: Not on file  Intimate Partner Violence: Not on file    Outpatient Medications Prior to Visit  Medication Sig Dispense Refill   busPIRone (BUSPAR) 7.5 MG tablet Take 7.5 mg by mouth 2 (two) times daily.     topiramate (TOPAMAX) 100 MG tablet TAKE 1 TABLET BY MOUTH TWICE A DAY 180 tablet 0   No facility-administered medications prior to visit.    No Known Allergies  Review of Systems  Constitutional:  Positive for fatigue.  Respiratory:  Negative for shortness of breath.   Cardiovascular:  Negative for chest pain.  Neurological:  Negative for dizziness (history of vertigo) and headaches.      Objective:    Physical Exam Constitutional:      General: She is not in acute distress.    Appearance: She is well-developed.  HENT:     Head: Normocephalic and atraumatic.     Right Ear: Hearing and tympanic membrane normal.     Left Ear: Hearing and tympanic membrane normal.     Nose: Nose normal.     Mouth/Throat:     Pharynx: Oropharynx is clear.  Eyes:     General: Lids are normal. No scleral icterus.       Right eye: No discharge.        Left eye: No discharge.     Conjunctiva/sclera: Conjunctivae normal.  Cardiovascular:     Rate and Rhythm: Normal rate and regular rhythm.     Heart sounds: Normal heart sounds.  Pulmonary:     Effort: Pulmonary effort is normal. No respiratory distress.  Abdominal:     General: Bowel sounds are normal.     Palpations: Abdomen is soft.  Musculoskeletal:        General: Normal range of motion.     Cervical back: Normal range of motion and neck supple.  Skin:    Findings: No lesion or rash.  Neurological:     Mental Status: She is alert and oriented to person, place, and time.  Psychiatric:        Speech: Speech normal.        Behavior: Behavior normal.        Thought Content: Thought content normal.    BP 110/74 (BP Location: Right Arm, Patient Position: Sitting, Cuff Size: Normal)   Pulse 74   Temp 98.6 F (37 C)  (Oral)   Wt 123 lb (55.8 kg)   SpO2 100%   BMI 21.79 kg/m  Wt Readings from Last 3 Encounters:  10/24/20 123 lb (55.8 kg)  08/16/20 122 lb (55.3 kg)  03/28/18 168 lb (76.2 kg)    Health Maintenance Due  Topic Date Due   COVID-19 Vaccine (1) Never done   INFLUENZA VACCINE  10/24/2020    There are no preventive care reminders to display for this patient.   Lab Results  Component Value Date   TSH 1.560 06/11/2019   Lab Results  Component Value Date   WBC 4.4 06/11/2019   HGB 15.7 06/11/2019   HCT 46.9 (H) 06/11/2019   MCV 91 06/11/2019   PLT 304 06/11/2019   Lab Results  Component Value Date   NA 138 06/11/2019   K 4.3 06/11/2019   CO2 18 (L) 06/11/2019   GLUCOSE 82 06/11/2019   BUN 16 06/11/2019   CREATININE 1.06 (H) 06/11/2019   BILITOT 0.4 06/11/2019   ALKPHOS 60 06/11/2019   AST 18 06/11/2019   ALT 19 06/11/2019   PROT 7.6 06/11/2019   ALBUMIN 5.2 (H) 06/11/2019   CALCIUM 9.5 06/11/2019   Lab Results  Component Value Date   CHOL 154 06/11/2019   Lab Results  Component Value Date   HDL 41 06/11/2019   Lab Results  Component Value Date   LDLCALC 97 06/11/2019   Lab Results  Component Value Date   TRIG 84 06/11/2019   Lab Results  Component Value Date   CHOLHDL 3.8 06/11/2019   No results found for: HGBA1C     Assessment & Plan:   1. Chronic fatigue Onset over the past 6-12 months. Sleeping well and normal appetite. No chest pain, palpitations, peripheral edema, GI symptoms or muscle weakness. May be related to medications used for migraines (Topamax) or anxiety (Buspar). Encouraged to eat regular meals three times a day. Check labs for anemia or metabolic disorders. - CBC with Differential/Platelet - Comprehensive metabolic panel - TSH - T4  2. Seasonal allergic rhinitis, unspecified trigger Some rhinorrhea and itchy irritation of eyes. May use Zyrtec qd and check labs. - CBC with Differential/Platelet    Adline Peals, CMA  I,  Nehemias Sauceda, PA-C, have reviewed  all documentation for this visit. The documentation on 10/24/20 for the exam, diagnosis, procedures, and orders are all accurate and complete.

## 2020-10-25 DIAGNOSIS — R5383 Other fatigue: Secondary | ICD-10-CM | POA: Diagnosis not present

## 2020-10-25 DIAGNOSIS — J302 Other seasonal allergic rhinitis: Secondary | ICD-10-CM | POA: Diagnosis not present

## 2020-10-25 DIAGNOSIS — R5382 Chronic fatigue, unspecified: Secondary | ICD-10-CM | POA: Diagnosis not present

## 2020-10-26 LAB — CBC WITH DIFFERENTIAL/PLATELET
Basophils Absolute: 0.1 10*3/uL (ref 0.0–0.2)
Basos: 1 %
EOS (ABSOLUTE): 0.3 10*3/uL (ref 0.0–0.4)
Eos: 5 %
Hematocrit: 41.3 % (ref 34.0–46.6)
Hemoglobin: 13.8 g/dL (ref 11.1–15.9)
Immature Grans (Abs): 0 10*3/uL (ref 0.0–0.1)
Immature Granulocytes: 0 %
Lymphocytes Absolute: 1.6 10*3/uL (ref 0.7–3.1)
Lymphs: 30 %
MCH: 29.5 pg (ref 26.6–33.0)
MCHC: 33.4 g/dL (ref 31.5–35.7)
MCV: 88 fL (ref 79–97)
Monocytes Absolute: 0.3 10*3/uL (ref 0.1–0.9)
Monocytes: 6 %
Neutrophils Absolute: 3.2 10*3/uL (ref 1.4–7.0)
Neutrophils: 58 %
Platelets: 318 10*3/uL (ref 150–450)
RBC: 4.68 x10E6/uL (ref 3.77–5.28)
RDW: 12.2 % (ref 11.7–15.4)
WBC: 5.5 10*3/uL (ref 3.4–10.8)

## 2020-10-26 LAB — COMPREHENSIVE METABOLIC PANEL
ALT: 13 IU/L (ref 0–32)
AST: 13 IU/L (ref 0–40)
Albumin/Globulin Ratio: 2.2 (ref 1.2–2.2)
Albumin: 4.8 g/dL (ref 3.9–5.0)
Alkaline Phosphatase: 71 IU/L (ref 44–121)
BUN/Creatinine Ratio: 7 — ABNORMAL LOW (ref 9–23)
BUN: 8 mg/dL (ref 6–20)
Bilirubin Total: 0.4 mg/dL (ref 0.0–1.2)
CO2: 20 mmol/L (ref 20–29)
Calcium: 9.1 mg/dL (ref 8.7–10.2)
Chloride: 109 mmol/L — ABNORMAL HIGH (ref 96–106)
Creatinine, Ser: 1.08 mg/dL — ABNORMAL HIGH (ref 0.57–1.00)
Globulin, Total: 2.2 g/dL (ref 1.5–4.5)
Glucose: 81 mg/dL (ref 65–99)
Potassium: 4.7 mmol/L (ref 3.5–5.2)
Sodium: 141 mmol/L (ref 134–144)
Total Protein: 7 g/dL (ref 6.0–8.5)
eGFR: 71 mL/min/{1.73_m2} (ref >59)

## 2020-10-26 LAB — T4: T4, Total: 5.6 ug/dL (ref 4.5–12.0)

## 2020-10-26 LAB — TSH: TSH: 1.59 u[IU]/mL (ref 0.450–4.500)

## 2020-11-02 ENCOUNTER — Encounter: Payer: Self-pay | Admitting: Family Medicine

## 2020-11-04 ENCOUNTER — Other Ambulatory Visit: Payer: Self-pay

## 2020-11-04 NOTE — Telephone Encounter (Signed)
Ok to refill 

## 2020-11-04 NOTE — Telephone Encounter (Signed)
[facility] faxed refill request for the following medications:  busPIRone (BUSPAR) 7.5 MG tablet    Please advise.  

## 2020-11-08 MED ORDER — BUSPIRONE HCL 7.5 MG PO TABS: 7.5000 mg | ORAL_TABLET | Freq: Two times a day (BID) | ORAL | 0 refills | Status: DC

## 2020-12-18 ENCOUNTER — Other Ambulatory Visit: Payer: Self-pay | Admitting: Family Medicine

## 2020-12-19 NOTE — Telephone Encounter (Signed)
Requested medication (s) are due for refill today: Yes  Requested medication (s) are on the active medication list: Yes  Last refill:  11/08/20  Future visit scheduled: No  Notes to clinic:  Pharmacy requests 90 day supply.    Requested Prescriptions  Pending Prescriptions Disp Refills   busPIRone (BUSPAR) 7.5 MG tablet [Pharmacy Med Name: BUSPIRONE HCL 7.5 MG TABLET] 180 tablet 1    Sig: TAKE 1 TABLET BY MOUTH 2 TIMES DAILY.     Psychiatry: Anxiolytics/Hypnotics - Non-controlled Passed - 12/18/2020  9:30 AM      Passed - Valid encounter within last 6 months    Recent Outpatient Visits           1 month ago Chronic fatigue   [facility] Chrismon, Jodell Cipro, PA-C   1 year ago Anxiety and depression   [facility] [place], [place], [place]   1 year ago Anxiety and depression   [facility] [place], [place], [place]   1 year ago Anxiety and depression   [facility] Osvaldo [place]st [place], [place]   2 years ago Anxiety and depression   [facility] [place], [place]

## 2021-01-03 ENCOUNTER — Telehealth: Payer: Self-pay | Admitting: Physician Assistant

## 2021-01-03 DIAGNOSIS — R519 Headache, unspecified: Secondary | ICD-10-CM

## 2021-01-03 MED ORDER — TOPIRAMATE 100 MG PO TABS: 100.0000 mg | ORAL_TABLET | Freq: Two times a day (BID) | ORAL | 0 refills | Status: DC

## 2021-01-03 NOTE — Telephone Encounter (Signed)
[facility] faxed refill request for the following medications:  topiramate (TOPAMAX) 100 MG tablet   Please advise.

## 2021-02-01 DIAGNOSIS — Z20822 Contact with and (suspected) exposure to covid-19: Secondary | ICD-10-CM | POA: Diagnosis not present

## 2021-02-01 DIAGNOSIS — Z03818 Encounter for observation for suspected exposure to other biological agents ruled out: Secondary | ICD-10-CM | POA: Diagnosis not present

## 2021-02-24 ENCOUNTER — Other Ambulatory Visit: Payer: Self-pay | Admitting: Family Medicine

## 2021-02-24 NOTE — Telephone Encounter (Signed)
Patient needs an appointment for future refills.  Please schedule. Courtesy fill sent in.

## 2021-03-25 ENCOUNTER — Other Ambulatory Visit: Payer: Self-pay | Admitting: Family Medicine

## 2021-03-25 NOTE — Telephone Encounter (Signed)
Message sent to pt via MyChart to make an appt for further refills  Last RF 02/24/21 #180 (courtesy RF)  Requested Prescriptions  Pending Prescriptions Disp Refills   busPIRone (BUSPAR) 7.5 MG tablet [Pharmacy Med Name: BUSPIRONE HCL 7.5 MG TABLET] 180 tablet 1    Sig: TAKE 1 TABLET BY MOUTH 2 TIMES DAILY.     Psychiatry: Anxiolytics/Hypnotics - Non-controlled Passed - 03/25/2021  9:35 AM      Passed - Valid encounter within last 6 months    Recent Outpatient Visits           5 months ago Chronic fatigue   [facility] Chrismon, Jodell Cipro, PA-C   1 year ago Anxiety and depression   [facility] [place], [place], [place]   1 year ago Anxiety and depression   [facility] [place], [place], [place]   1 year ago Anxiety and depression   [facility] Osvaldo Angst M, [place]   2 years ago Anxiety and depression   [facility] Os[place]do Angst M, [place]

## 2021-03-31 ENCOUNTER — Other Ambulatory Visit: Payer: Self-pay | Admitting: Family Medicine

## 2021-03-31 DIAGNOSIS — R519 Headache, unspecified: Secondary | ICD-10-CM

## 2021-03-31 NOTE — Telephone Encounter (Signed)
Requested medication (s) are due for refill today:   Provider to review  Requested medication (s) are on the active medication list:   Yes  Future visit scheduled:   No   Last ordered: 01/03/2021 #180, 0 refills  Non delegated refill   Requested Prescriptions  Pending Prescriptions Disp Refills   topiramate (TOPAMAX) 100 MG tablet [Pharmacy Med Name: TOPIRAMATE 100 MG TABLET] 180 tablet 0    Sig: TAKE 1 TABLET BY MOUTH TWICE A DAY     Not Delegated - Neurology: Anticonvulsants - topiramate & zonisamide Failed - 03/31/2021  1:46 AM      Failed - This refill cannot be delegated      Failed - Cr in normal range and within 360 days    Creatinine, Ser  Date Value Ref Range Status  10/25/2020 1.08 (H) 0.57 - 1.00 mg/dL Final          Passed - CO2 in normal range and within 360 days    CO2  Date Value Ref Range Status  10/25/2020 20 20 - 29 mmol/L Final          Passed - Valid encounter within last 12 months    Recent Outpatient Visits           5 months ago Chronic fatigue   [facility], Jodell Cipro, PA-C   1 year ago Anxiety and depression   [employer], [place], [place]   1 year ago Anxiety and depression   [facility] [place], [place], [place]   1 year ago Anxiety and depression   [facility] [place], [place], [place]   3 years ago Anxiety and depression   [facility] [place], [place], [place]

## 2021-04-05 ENCOUNTER — Telehealth: Payer: Self-pay | Admitting: Family Medicine

## 2021-04-06 ENCOUNTER — Other Ambulatory Visit: Payer: Self-pay | Admitting: Family Medicine

## 2021-04-06 MED ORDER — BUSPIRONE HCL 10 MG PO TABS: 10.0000 mg | ORAL_TABLET | Freq: Two times a day (BID) | ORAL | 1 refills | Status: DC

## 2021-04-06 NOTE — Telephone Encounter (Signed)
Patient has been advised. KW 

## 2021-04-06 NOTE — Telephone Encounter (Signed)
Pt's current Rx busPIRone (BUSPAR) 7.5 MG tablet Is not covered by her insurance, But the 10 mg is. Pt needs new rx for 10 mg.. Pt took her last pill today. Pt has appt first available 04/18/21.   Please advise should pt be worked in for sooner appt?.  [id number] [place], [place]

## 2021-04-06 NOTE — Telephone Encounter (Signed)
Please review and advise if dosage change needs to be adjusted or will we discuss at office visit and okay to wait? KW

## 2021-04-06 NOTE — Telephone Encounter (Signed)
Requested medication (s) are due for refill today:   New prescription by Robynn Pane  Requested medication (s) are on the active medication list:   Yes  Future visit scheduled:   No   Last ordered: 03/28/2021 #180, 1 refill  Returned because this strength is not on the formulary per pharmacy.   Requested Prescriptions  Pending Prescriptions Disp Refills   busPIRone (BUSPAR) 7.5 MG tablet [Pharmacy Med Name: BUSPIRONE HCL 7.5 MG TABLET] 180 tablet 1    Sig: TAKE 1 TABLET BY MOUTH TWICE A DAY     Psychiatry: Anxiolytics/Hypnotics - Non-controlled Passed - 04/05/2021  5:25 PM      Passed - Valid encounter within last 6 months    Recent Outpatient Visits           5 months ago Chronic fatigue   [facility] Chrismon, Jodell Cipro, PA-C   1 year ago Anxiety and depression   [facility] [place], [place], [place]   1 year ago Anxiety and depression   [facility] [place], [place], [place]   1 year ago Anxiety and depression   [facility] [place], [place], [place]   3 years ago Anxiety and depression   [facility] [place], [place], [place]

## 2021-04-18 ENCOUNTER — Telehealth: Payer: BC Managed Care – PPO | Admitting: Physician Assistant

## 2021-04-18 ENCOUNTER — Ambulatory Visit: Payer: BC Managed Care – PPO | Admitting: Physician Assistant

## 2021-04-18 DIAGNOSIS — F322 Major depressive disorder, single episode, severe without psychotic features: Secondary | ICD-10-CM | POA: Insufficient documentation

## 2021-04-18 NOTE — Addendum Note (Signed)
Addended byAlfredia Ferguson on: 04/18/2021 11:43 AM   Modules accepted: Orders

## 2021-04-18 NOTE — Progress Notes (Signed)
MyChart Video Visit    Virtual Visit via Video Note   This visit type was conducted due to national recommendations for restrictions regarding the COVID-19 Pandemic (e.g. social distancing) in an effort to limit this patient's exposure and mitigate transmission in our community. This patient is at least at moderate risk for complications without adequate follow up. This format is felt to be most appropriate for this patient at this time. Physical exam was limited by quality of the video and audio technology used for the visit.   Patient location: work, in a secure private office with the door closed Provider location: [facility]  I discussed the limitations of evaluation and management by telemedicine and the availability of in person appointments. The patient expressed understanding and agreed to proceed.  Patient: Rhonda Sweeney   DOB: [birth year]   30 y.o. Female  MRN: [id number] Visit Date: 04/18/2021  Today's healthcare provider: Alfredia Ferguson, PA-C   Cc. Anxiety/depression f/u  Subjective    HPI  Anxiety, Follow-up   Changes made at last visit include increasing buspirone to 10mg  twice a day on 04/06/2021 She was previously on lexapro, and then transitioned to zoloft 100 mg, which she thought was causing more headaches, which led her to switch to buspirone.  She reports excellent compliance with treatment. She reports excellent tolerance of treatment. She is not having side effects.   Pt states her symptoms have improved but not to goal.  She reports over the last week feeling more lightheaded, and having one breakthrough migraine for which she had to take off of work.   Overall feels her depressive symptoms more prevalent than her anxious symptoms, some transient thoughts of SI or self harm that are upsetting, but always transient, no plan or rumination of self harm.  GAD-7 Results GAD-7 Generalized Anxiety Disorder Screening Tool 04/18/2021 09/15/2019 05/21/2019  1.  Feeling Nervous, Anxious, or on Edge 3 1 0  2. Not Being Able to Stop or Control Worrying 2 0 0  3. Worrying Too Much About Different Things 3 1 2   4. Trouble Relaxing 3 2 2   5. Being So Restless it's Hard To Sit Still 1 1 0  6. Becoming Easily Annoyed or Irritable 3 1 1   7. Feeling Afraid As If Something Awful Might Happen 0 0 0  Total GAD-7 Score 15 6 5   Difficulty At Work, Home, or Getting  Along With Others? Somewhat difficult Somewhat difficult Somewhat difficult    PHQ-9 Scores PHQ9 SCORE ONLY 04/18/2021 10/24/2020 09/15/2019  PHQ-9 Total Score 10 5 4     ---------------------------------------------------------------------------------------------------  Medications: Outpatient Medications Prior to Visit  Medication Sig   busPIRone (BUSPAR) 10 MG tablet Take 1 tablet (10 mg total) by mouth 2 (two) times daily.   topiramate (TOPAMAX) 100 MG tablet TAKE 1 TABLET BY MOUTH TWICE A DAY   No facility-administered medications prior to visit.    Review of Systems  Constitutional: Negative.   Psychiatric/Behavioral:  Positive for decreased concentration, dysphoric mood and suicidal ideas. Negative for self-injury. The patient is nervous/anxious.      Objective    There were no vitals taken for this visit.   Physical Exam Neurological:     Mental Status: She is oriented to person, place, and time.  Psychiatric:        Mood and Affect: Mood is depressed. Affect is tearful.        Behavior: Behavior normal.       Assessment & Plan  Problem List Items Addressed This Visit       Other   Depression, major, single episode, severe (HCC) - Primary    Recently increased Buspirone dose from 7.5 bid to 10 mg bid. Plan today to increase to 10 mg in AM and 20 mg in PM. F/u 2 week for SE and to see if additional titration is needed. Encouraged she find a therapist/counselor. Advised she try through her insurance, but will send referral today as well.  Discussion on SI, only  transient, will check in at next appt as well.         Return in about 3 weeks (around 05/09/2021) for depression.     I discussed the assessment and treatment plan with the patient. The patient was provided an opportunity to ask questions and all were answered. The patient agreed with the plan and demonstrated an understanding of the instructions.   The patient was advised to call back or seek an in-person evaluation if the symptoms worsen or if the condition fails to improve as anticipated.  I provided 25 minutes of non-face-to-face time during this encounter.   I,Laura E Walsh,acting as a Neurosurgeon for [employer], PA-C.,have documented all relevant documentation on the behalf of Alfredia Ferguson, PA-C,as directed by  Alfredia Ferguson, PA-C while in the presence of Alfredia Ferguson, PA-C.  I, Alfredia Ferguson, PA-C have reviewed all documentation for this visit. The documentation on  04/18/2021 for the exam, diagnosis, procedures, and orders are all accurate and complete.   Alfredia Ferguson, PA-C [facility] [phone] (phone) [phone] (fax)  [facility]

## 2021-04-18 NOTE — Assessment & Plan Note (Signed)
Recently increased Buspirone dose from 7.5 bid to 10 mg bid. Plan today to increase to 10 mg in AM and 20 mg in PM. F/u 2 week for SE and to see if additional titration is needed. Encouraged she find a therapist/counselor. Advised she try through her insurance, but will send referral today as well.  Discussion on SI, only transient, will check in at next appt as well.

## 2021-05-01 NOTE — Progress Notes (Signed)
I,Sha'taria Tyson,acting as a Neurosurgeon for [employer], PA-C.,have documented all relevant documentation on the behalf of Alfredia Ferguson, PA-C,as directed by  Alfredia Ferguson, PA-C while in the presence of Alfredia Ferguson, PA-C.  MyChart Video Visit    Virtual Visit via Video Note   This visit type was conducted due to national recommendations for restrictions regarding the COVID-19 Pandemic (e.g. social distancing) in an effort to limit this patient's exposure and mitigate transmission in our community. This patient is at least at moderate risk for complications without adequate follow up. This format is felt to be most appropriate for this patient at this time. Physical exam was limited by quality of the video and audio technology used for the visit.   Patient location: work, secure location with door closed Provider location: [facility]  I discussed the limitations of evaluation and management by telemedicine and the availability of in person appointments. The patient expressed understanding and agreed to proceed.  Patient: Rhonda Sweeney   DOB: 1990/06/26   30 y.o. Female  MRN: [id number] Visit Date: 05/02/2021  Today's healthcare provider: Alfredia Ferguson, PA-C   Cc. Depression f/u  Subjective    HPI   Depression, Follow-up  She  was last seen for this 2 weeks ago. Changes made at last visit include increase to 10 mg in AM and 20 mg in PM..   She reports poor compliance with treatment., she started having side effects. Nausea, brain fogs, dizziness and migraines. After 2-3 days went back down to 10 mg BID. Feels stable on this. She has not reached out to coordinate an appointment w/ a therapist as she may be changing jobs and may change insurance.  Depression screen [facility] 2/9 04/18/2021 10/24/2020 09/15/2019  Decreased Interest 1 1 0  Down, Depressed, Hopeless 1 0 0  PHQ - 2 Score 2 1 0  Altered sleeping 1 0 0  Tired, decreased energy 3 2 2   Change in  appetite 1 0 2  Feeling bad or failure about yourself  1 0 0  Trouble concentrating 0 2 0  Moving slowly or fidgety/restless 1 0 0  Suicidal thoughts 1 0 0  PHQ-9 Score 10 5 4   Difficult doing work/chores Somewhat difficult Somewhat difficult Somewhat difficult   Difficult virtual connection, unable to ask pt PHQ9 questions. -----------------------------------------------------------------------------------------   Medications: Outpatient Medications Prior to Visit  Medication Sig   busPIRone (BUSPAR) 10 MG tablet Take 1 tablet (10 mg total) by mouth 2 (two) times daily.   topiramate (TOPAMAX) 100 MG tablet TAKE 1 TABLET BY MOUTH TWICE A DAY   No facility-administered medications prior to visit.    Review of Systems  Constitutional:  Negative for fatigue and fever.  Respiratory:  Negative for cough and shortness of breath.   Cardiovascular:  Negative for chest pain and leg swelling.  Gastrointestinal:  Negative for abdominal pain.  Neurological:  Negative for dizziness and headaches.    Objective    No vitals taken for this appt.  Physical Exam Constitutional:      Appearance: Normal appearance.  Neurological:     Mental Status: She is alert.  Psychiatric:        Mood and Affect: Mood normal.        Behavior: Behavior normal.       Assessment & Plan     Problem List Items Addressed This Visit       Other   Depression, major, single episode, severe (HCC)  Okay with continuing with 10 mg BID. Stressed importance of therapist/counselor. We will touch base again in a few weeks after she has decided if she is staying or changing jobs.         Return in about 6 weeks (around 06/13/2021) for anxiety, depression.     I discussed the assessment and treatment plan with the patient. The patient was provided an opportunity to ask questions and all were answered. The patient agreed with the plan and demonstrated an understanding of the instructions.   The patient was  advised to call back or seek an in-person evaluation if the symptoms worsen or if the condition fails to improve as anticipated.  I provided 15 minutes of non-face-to-face time during this encounter.  I, Alfredia Ferguson, PA-C have reviewed all documentation for this visit. The documentation on  05/02/2021  for the exam, diagnosis, procedures, and orders are all accurate and complete.   Alfredia Ferguson, PA-C [facility] [phone] (phone) [phone] (fax)  [facility]

## 2021-05-02 ENCOUNTER — Telehealth: Payer: BC Managed Care – PPO | Admitting: Physician Assistant

## 2021-05-02 ENCOUNTER — Encounter: Payer: Self-pay | Admitting: Physician Assistant

## 2021-05-02 DIAGNOSIS — F322 Major depressive disorder, single episode, severe without psychotic features: Secondary | ICD-10-CM

## 2021-05-02 NOTE — Assessment & Plan Note (Signed)
Okay with continuing with 10 mg BID. Stressed importance of therapist/counselor. We will touch base again in a few weeks after she has decided if she is staying or changing jobs.

## 2021-05-24 DIAGNOSIS — R112 Nausea with vomiting, unspecified: Secondary | ICD-10-CM | POA: Diagnosis not present

## 2021-05-24 DIAGNOSIS — R197 Diarrhea, unspecified: Secondary | ICD-10-CM | POA: Diagnosis not present

## 2021-05-24 DIAGNOSIS — B349 Viral infection, unspecified: Secondary | ICD-10-CM | POA: Diagnosis not present

## 2021-06-10 IMAGING — US US BREAST*R* LIMITED INC AXILLA
1 series · 12 of 12 positions shown · non-contrast
Comparison: None.

CLINICAL DATA: 29-year-old female presenting for evaluation of a
palpable lump in the right breast. She noticed this 5 months ago,
and feels there has not been any change in size since she first
identified it.

EXAM:
ULTRASOUND OF THE RIGHT BREAST

[Series 1: us breast*right* limited inc axilla · 0.06mm/px · 12 of 12 slices shown]
[im 1/12]
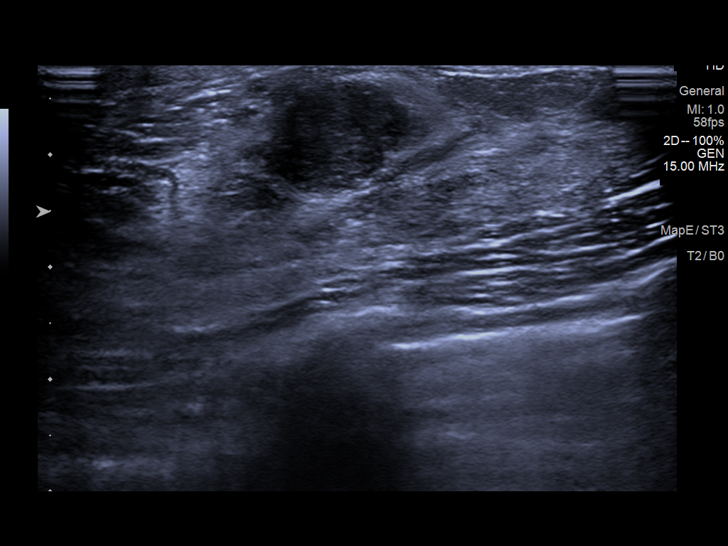
[im 2/12]
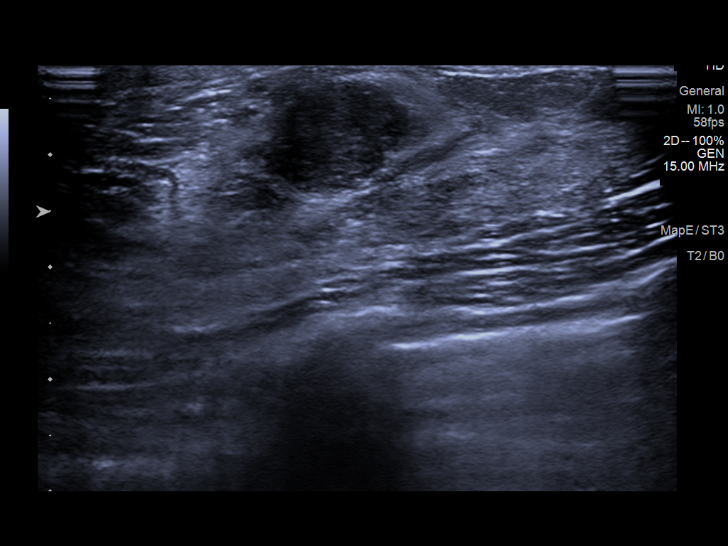
[im 3/12]
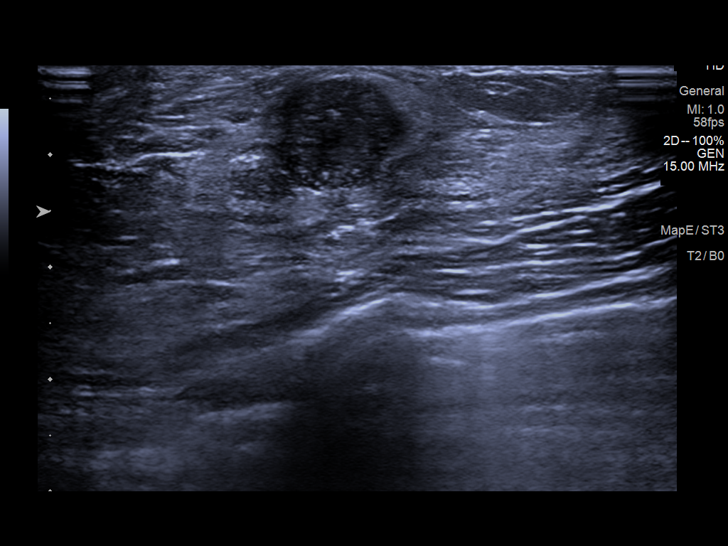
[im 4/12]
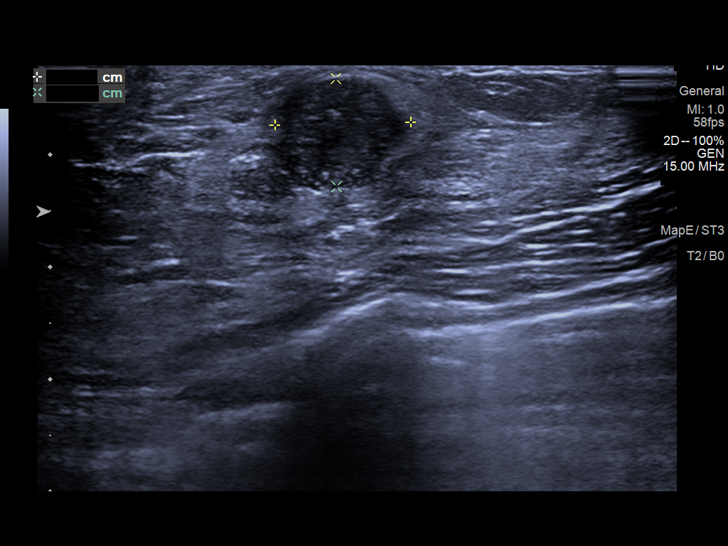
[im 5/12]
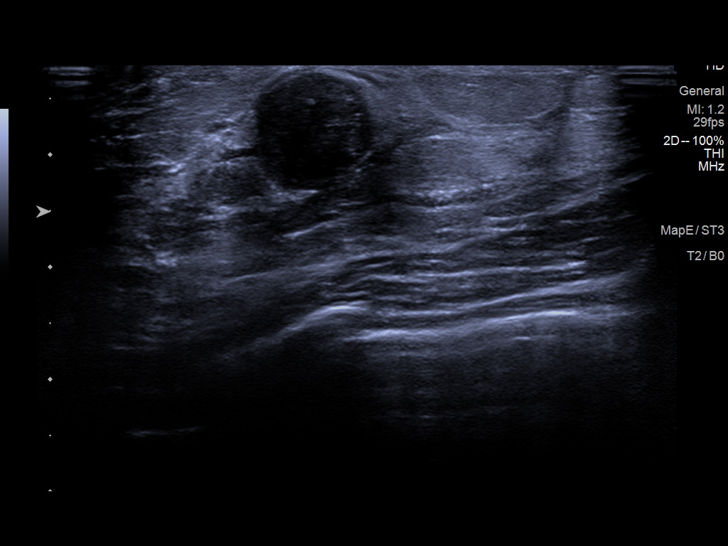
[im 6/12]
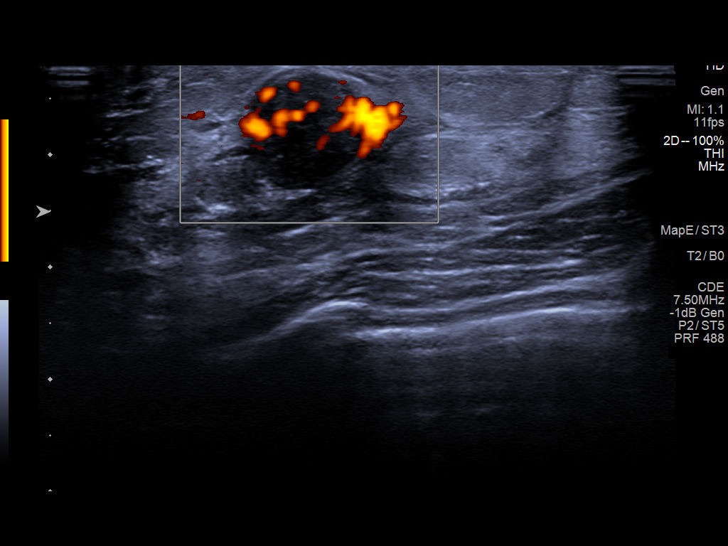
[im 7/12]
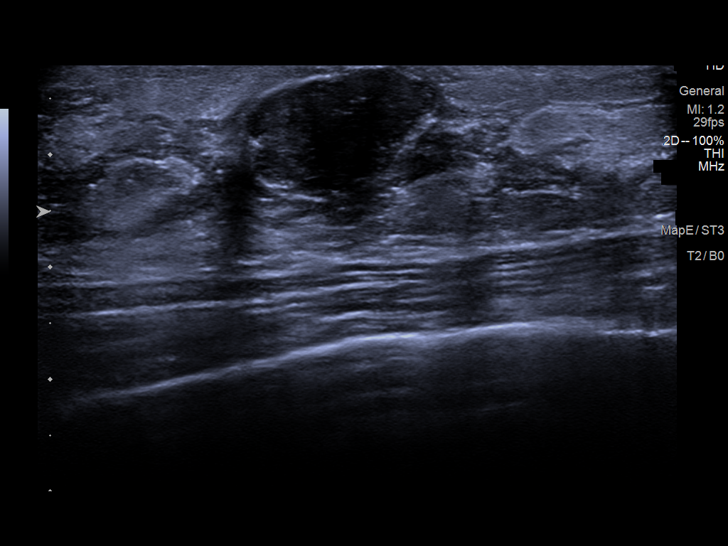
[im 8/12]
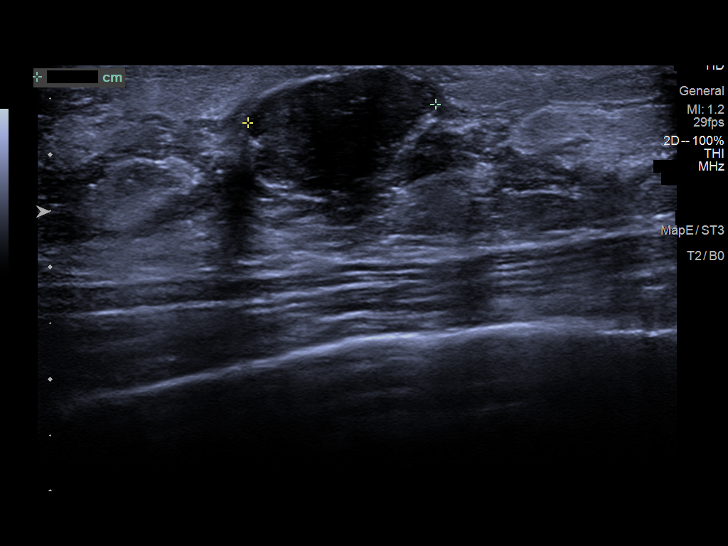
[im 9/12]
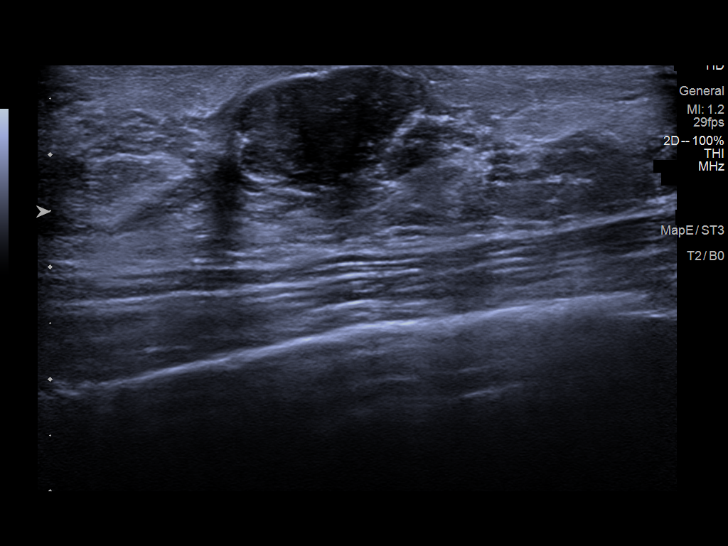
[im 10/12]
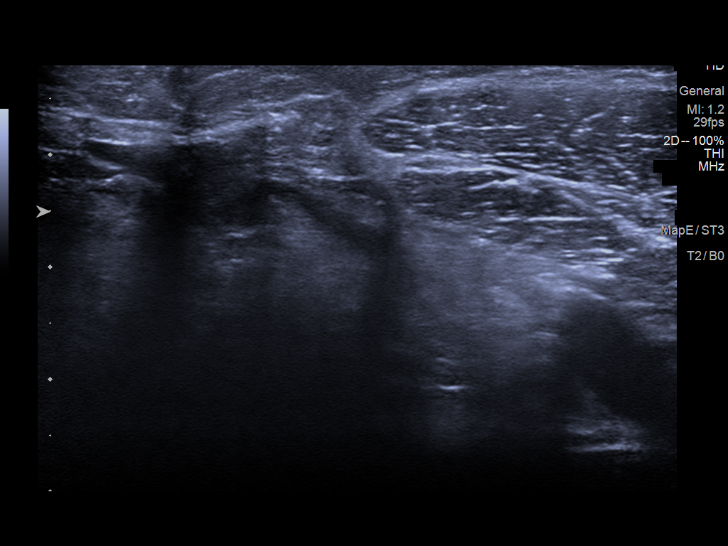
[im 11/12]
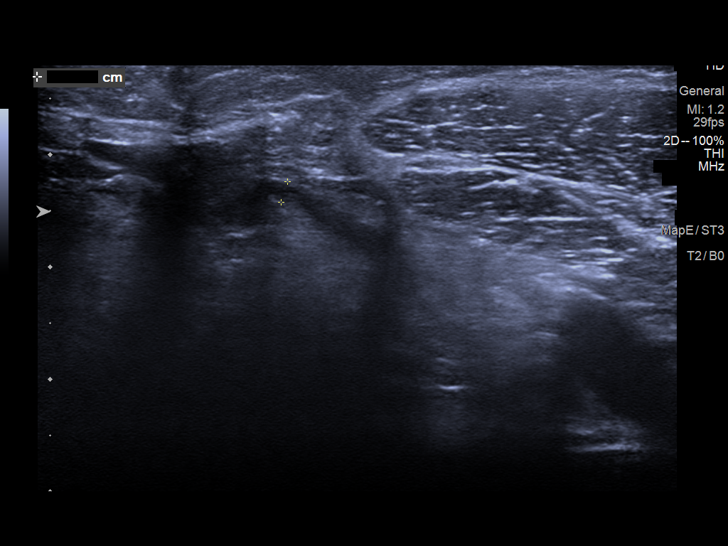
[im 12/12]
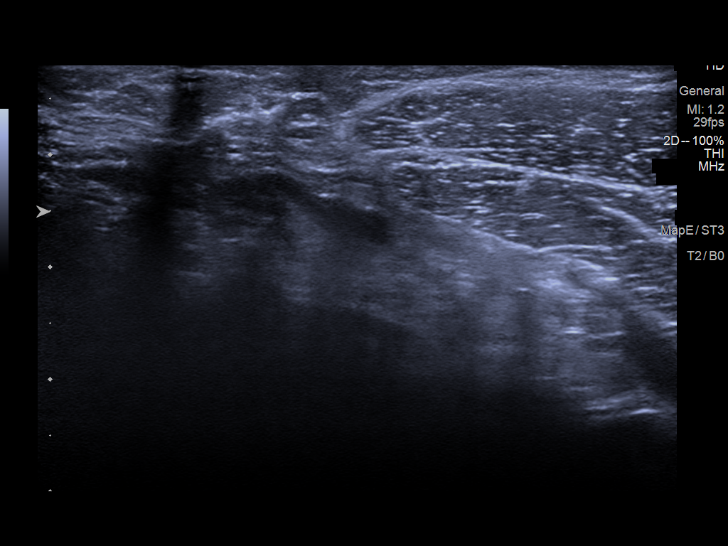

[12 of 12 positions shown; findings below may reference images not displayed]

FINDINGS: Ultrasound of the right breast at 1 o'clock, 1 cm from the nipple
demonstrates a circumscribed oval hypoechoic mass measuring 1.7 x
1.0 x 1.2 cm.
IMPRESSION: There is a benign appearing mass in the right breast at 1 o'clock,
favored to represent a fibroadenoma.

RECOMMENDATION:
Six-month follow-up right breast ultrasound. The patient was
instructed to return sooner for evaluation if she feels that the
mass is increasing in size prior to the planned follow-up.

I have discussed the findings and recommendations with the patient.
If applicable, a reminder letter will be sent to the patient
regarding the next appointment.

BI-RADS CATEGORY  3: Probably benign.

## 2021-09-29 ENCOUNTER — Other Ambulatory Visit: Payer: Self-pay | Admitting: Family Medicine

## 2021-09-29 NOTE — Telephone Encounter (Signed)
Requested Prescriptions  Pending Prescriptions Disp Refills  . busPIRone (BUSPAR) 10 MG tablet [Pharmacy Med Name: BUSPIRONE HCL 10 MG TABLET] 180 tablet 0    Sig: TAKE 1 TABLET BY MOUTH TWICE A DAY     Psychiatry: Anxiolytics/Hypnotics - Non-controlled Passed - 09/29/2021  2:01 AM      Passed - Valid encounter within last 12 months    Recent Outpatient Visits          5 months ago Depression, major, single episode, severe (HCC)   [facility] [place], [employer], PA-C   5 months ago Depression, major, single episode, severe (HCC)   [facility] [place], [place], PA-C   11 months ago Chronic fatigue   [facility], [place], PA-C   2 years ago Anxiety and depression   [facility] [place], [place], [place]   2 years ago Anxiety and depression   [facility] [place], [place], [place]

## 2021-12-25 ENCOUNTER — Other Ambulatory Visit: Payer: Self-pay | Admitting: Family Medicine

## 2022-03-26 ENCOUNTER — Other Ambulatory Visit: Payer: Self-pay | Admitting: Family Medicine

## 2022-03-30 NOTE — Telephone Encounter (Signed)
Called patient to schedule a fu depression and refill on buspirone. Patient reports she is no longer taking medication. And to disregard refill request.

## 2022-12-04 ENCOUNTER — Telehealth (INDEPENDENT_AMBULATORY_CARE_PROVIDER_SITE_OTHER): Payer: Managed Care, Other (non HMO) | Admitting: Physician Assistant

## 2022-12-04 ENCOUNTER — Encounter: Payer: Self-pay | Admitting: Physician Assistant

## 2022-12-04 DIAGNOSIS — R519 Headache, unspecified: Secondary | ICD-10-CM | POA: Insufficient documentation

## 2022-12-04 DIAGNOSIS — F419 Anxiety disorder, unspecified: Secondary | ICD-10-CM

## 2022-12-04 DIAGNOSIS — G43019 Migraine without aura, intractable, without status migrainosus: Secondary | ICD-10-CM

## 2022-12-04 DIAGNOSIS — F322 Major depressive disorder, single episode, severe without psychotic features: Secondary | ICD-10-CM

## 2022-12-04 DIAGNOSIS — R5383 Other fatigue: Secondary | ICD-10-CM | POA: Insufficient documentation

## 2022-12-04 DIAGNOSIS — F32A Depression, unspecified: Secondary | ICD-10-CM

## 2022-12-04 MED ORDER — BUSPIRONE HCL 10 MG PO TABS: ORAL_TABLET | ORAL | 0 refills | Status: DC

## 2022-12-04 MED ORDER — TOPIRAMATE 25 MG PO TABS: 25.0000 mg | ORAL_TABLET | Freq: Two times a day (BID) | ORAL | 0 refills | Status: DC

## 2022-12-04 NOTE — Progress Notes (Signed)
MyChart Video Visit  Virtual Visit via Video Note   This format is felt to be most appropriate for this patient at this time. Physical exam was limited by quality of the video and audio technology used for the visit.   Patient location: office Patient Location: Home  I discussed the limitations of evaluation and management by telemedicine and the availability of in person appointments. The patient expressed understanding and agreed to proceed.  Patient: Rhonda Sweeney   DOB: [birth year]   32 y.o. Female  MRN: [id number] Visit Date: 12/04/2022  Today's healthcare provider: Debera Lat, PA-C   Chief Complaint  Patient presents with   Medical Management of Chronic Issues    Would like to discuss being put back on topiramate and buspiron, would also like to discuss hormone levels   Subjective     Discussed the use of AI scribe software for clinical note transcription with the patient, who gave verbal consent to proceed.  History of Present Illness   The patient, with a past medical history of anxiety and migraines, presents with a recurrence of these conditions after discontinuing their medications, Topiramate and Buspirone, a few months ago. The patient reports that their anxiety has been bothersome over half the days in the past two weeks, with trouble relaxing and restlessness being daily issues.  The patient's migraines, which used to occur weekly, have lessened in frequency but daily headaches persist.  The patient also reports increased fatigue and emotional instability, suspecting a hormonal imbalance.  The patient notes worsening premenstrual symptoms over the past six months, including severe headaches, extreme fatigue, intense cramps radiating to the back, and nausea. The patient also reports variability in menstrual flow, with some periods being heavier than others.        12/04/2022    1:39 PM 04/18/2021    9:06 AM 09/15/2019   11:11 AM 05/21/2019    1:21 PM  GAD 7 :  Generalized Anxiety Score  Nervous, Anxious, on Edge 2 3 1  0  Control/stop worrying 1 2 0 0  Worry too much - different things 1 3 1 2   Trouble relaxing 3 3 2 2   Restless 3 1 1  0  Easily annoyed or irritable 1 3 1 1   Afraid - awful might happen 1 0 0 0  Total GAD 7 Score 12 15 6 5   Anxiety Difficulty  Somewhat difficult Somewhat difficult Somewhat difficult        Medications: Outpatient Medications Prior to Visit  Medication Sig   topiramate (TOPAMAX) 100 MG tablet TAKE 1 TABLET BY MOUTH TWICE A DAY   No facility-administered medications prior to visit.    Review of Systems  All other systems reviewed and are negative.  Except see HPI       Objective    There were no vitals taken for this visit.      Physical Exam   Technical difficulties, cannot observe patient   Assessment & Plan        Anxiety Moderate symptoms reported over the past two weeks. Previously managed with Buspar 10mg  daily, but patient self-discontinued due to perceived side effects. -Resume Buspar 10mg  daily, starting with 5mg  daily to monitor for side effects. -Schedule follow-up in six weeks to assess response to treatment.  Migraine Daily headaches reported, previously managed with Topiramate 100mg  daily, but patient self-discontinued due to perceived side effects. -Resume Topiramate 100mg  daily, starting with 25mg  twice daily to monitor for side effects. -Referral to neurology for further evaluation  and management.  Menstrual Irregularities Reports of irregular and heavy menstrual periods, severe premenstrual symptoms, and weight gain. -Referral to OBGYN for further evaluation and management.  Other fatigue -Order comprehensive blood work to assess for potential causes of fatigue, including complete blood count, electrolytes, liver and kidney function, lipid panel, and glucose level.  Follow-up Schedule in-person visit in six weeks to assess response to treatment and discuss lab  results.      Return in about 6 weeks (around 01/15/2023) for chronic disease f/u.     I discussed the assessment and treatment plan with the patient. The patient was provided an opportunity to ask questions and all were answered. The patient agreed with the plan and demonstrated an understanding of the instructions.   The patient was advised to call back or seek an in-person evaluation if the symptoms worsen or if the condition fails to improve as anticipated.  I provided 20 minutes of non-face-to-face time during this encounter.  I, Debera Lat, PA-C have reviewed all documentation for this visit. The documentation on  12/04/22 for the exam, diagnosis, procedures, and orders are all accurate and complete.  Debera Lat, [facility], MMS [facility] [phone] (phone) [phone] (fax)  [facility]

## 2022-12-05 ENCOUNTER — Telehealth: Payer: Self-pay

## 2022-12-05 ENCOUNTER — Encounter: Payer: Self-pay | Admitting: Physician Assistant

## 2022-12-05 DIAGNOSIS — R5383 Other fatigue: Secondary | ICD-10-CM

## 2022-12-05 NOTE — Telephone Encounter (Signed)
Copied from CRM [id number]. Topic: General - Other >> Dec 05, 2022 11:37 AM Franchot Heidelberg wrote: Reason for CRM: Pt is requesting estrogen and progesterone labs as well, please advise. She wants those levels checked

## 2022-12-08 LAB — CBC WITH DIFFERENTIAL/PLATELET
Basophils Absolute: 0.1 10*3/uL (ref 0.0–0.2)
Basos: 1 %
EOS (ABSOLUTE): 0.2 10*3/uL (ref 0.0–0.4)
Eos: 4 %
Hematocrit: 45.2 % (ref 34.0–46.6)
Hemoglobin: 15 g/dL (ref 11.1–15.9)
Immature Grans (Abs): 0 10*3/uL (ref 0.0–0.1)
Immature Granulocytes: 0 %
Lymphocytes Absolute: 1.1 10*3/uL (ref 0.7–3.1)
Lymphs: 23 %
MCH: 30.1 pg (ref 26.6–33.0)
MCHC: 33.2 g/dL (ref 31.5–35.7)
MCV: 91 fL (ref 79–97)
Monocytes Absolute: 0.3 10*3/uL (ref 0.1–0.9)
Monocytes: 6 %
Neutrophils Absolute: 3.2 10*3/uL (ref 1.4–7.0)
Neutrophils: 66 %
Platelets: 271 10*3/uL (ref 150–450)
RBC: 4.99 x10E6/uL (ref 3.77–5.28)
RDW: 11.9 % (ref 11.7–15.4)
WBC: 4.9 10*3/uL (ref 3.4–10.8)

## 2022-12-08 LAB — COMPREHENSIVE METABOLIC PANEL
ALT: 16 IU/L (ref 0–32)
AST: 15 IU/L (ref 0–40)
Albumin: 4.5 g/dL (ref 3.9–4.9)
Alkaline Phosphatase: 52 IU/L (ref 44–121)
BUN/Creatinine Ratio: 10 (ref 9–23)
BUN: 10 mg/dL (ref 6–20)
Bilirubin Total: 0.7 mg/dL (ref 0.0–1.2)
CO2: 21 mmol/L (ref 20–29)
Calcium: 9.4 mg/dL (ref 8.7–10.2)
Chloride: 105 mmol/L (ref 96–106)
Creatinine, Ser: 1.02 mg/dL — ABNORMAL HIGH (ref 0.57–1.00)
Globulin, Total: 2.5 g/dL (ref 1.5–4.5)
Glucose: 84 mg/dL (ref 70–99)
Potassium: 4.5 mmol/L (ref 3.5–5.2)
Sodium: 140 mmol/L (ref 134–144)
Total Protein: 7 g/dL (ref 6.0–8.5)
eGFR: 75 mL/min/{1.73_m2} (ref >59)

## 2022-12-08 LAB — TSH: TSH: 1.35 u[IU]/mL (ref 0.450–4.500)

## 2022-12-08 LAB — LIPID PANEL
Chol/HDL Ratio: 3.3 ratio (ref 0.0–4.4)
Cholesterol, Total: 150 mg/dL (ref 100–199)
HDL: 46 mg/dL (ref >39)
LDL Chol Calc (NIH): 87 mg/dL (ref 0–99)
Triglycerides: 88 mg/dL (ref 0–149)
VLDL Cholesterol Cal: 17 mg/dL (ref 5–40)

## 2022-12-08 LAB — HEMOGLOBIN A1C
Est. average glucose Bld gHb Est-mCnc: 91 mg/dL
Hgb A1c MFr Bld: 4.8 % (ref 4.8–5.6)

## 2022-12-19 LAB — TESTOSTERONE FREE, PROFILE I
Albumin: 4.7 g/dL (ref 3.9–4.9)
Sex Hormone Binding: 61.1 nmol/L (ref 24.6–122.0)
Testost., Free, Calc: 2.2 pg/mL (ref 0.7–7.9)
Testosterone: 19 ng/dL (ref 8–60)

## 2022-12-19 LAB — FSH/LH
FSH: 7.1 m[IU]/mL
LH: 14.5 m[IU]/mL

## 2022-12-19 LAB — TSH: TSH: 1.41 u[IU]/mL (ref 0.450–4.500)

## 2022-12-19 LAB — PROLACTIN: Prolactin: 13 ng/mL (ref 4.8–33.4)

## 2022-12-19 LAB — ESTRADIOL: Estradiol: 54.2 pg/mL

## 2022-12-19 LAB — 17-HYDROXYPROGESTERONE

## 2023-01-14 ENCOUNTER — Encounter: Payer: Self-pay | Admitting: Physician Assistant

## 2023-01-14 ENCOUNTER — Telehealth (INDEPENDENT_AMBULATORY_CARE_PROVIDER_SITE_OTHER): Payer: Managed Care, Other (non HMO) | Admitting: Physician Assistant

## 2023-01-14 DIAGNOSIS — F32A Depression, unspecified: Secondary | ICD-10-CM

## 2023-01-14 DIAGNOSIS — N926 Irregular menstruation, unspecified: Secondary | ICD-10-CM

## 2023-01-14 DIAGNOSIS — G43809 Other migraine, not intractable, without status migrainosus: Secondary | ICD-10-CM | POA: Diagnosis not present

## 2023-01-14 DIAGNOSIS — R7989 Other specified abnormal findings of blood chemistry: Secondary | ICD-10-CM | POA: Diagnosis not present

## 2023-01-14 DIAGNOSIS — F419 Anxiety disorder, unspecified: Secondary | ICD-10-CM

## 2023-01-14 MED ORDER — BUSPIRONE HCL 10 MG PO TABS
ORAL_TABLET | ORAL | 1 refills | Status: DC
Start: 2023-01-14 — End: 2023-07-22

## 2023-01-14 NOTE — Progress Notes (Signed)
MyChart Video Visit  Virtual Visit via Video Note   This format is felt to be most appropriate for this patient at this time. Physical exam was limited by quality of the video and audio technology used for the visit.   Patient location: office Patient Location: Home  I discussed the limitations of evaluation and management by telemedicine and the availability of in person appointments. The patient expressed understanding and agreed to proceed.  Patient: Rhonda Sweeney   DOB: March 20, 1991   32 y.o. Female  MRN: [id number] Visit Date: 01/14/2023  Today's healthcare provider: Debera Lat, PA-C   No chief complaint on file.  Subjective     Discussed the use of AI scribe software for clinical note transcription with the patient, who gave verbal consent to proceed.  History of Present Illness   The patient, with a history of anxiety and migraines, reports improvement in anxiety symptoms on buspirone. He has not yet started topiramate for migraines, as he wanted to adjust to the buspirone first. The patient reports that his migraines are frequent but not severe enough to warrant medication.  The patient also reports heavy menstrual periods. A blood panel for hormones was done and came back normal. The patient has not yet seen an OB GYN for this issue.  The patient's recent labs showed slightly elevated creatinine levels. The patient reports drinking plenty of water and has no history of kidney problems.        01/14/2023    2:27 PM 12/04/2022    1:39 PM 04/18/2021    9:06 AM 09/15/2019   11:11 AM  GAD 7 : Generalized Anxiety Score  Nervous, Anxious, on Edge 1 2 3 1   Control/stop worrying 0 1 2 0  Worry too much - different things 1 1 3 1   Trouble relaxing 1 3 3 2   Restless 1 3 1 1   Easily annoyed or irritable 1 1 3 1   Afraid - awful might happen 0 1 0 0  Total GAD 7 Score 5 12 15 6   Anxiety Difficulty Not difficult at all  Somewhat difficult Somewhat difficult      Medications: Outpatient Medications Prior to Visit  Medication Sig   topiramate (TOPAMAX) 100 MG tablet TAKE 1 TABLET BY MOUTH TWICE A DAY   topiramate (TOPAMAX) 25 MG tablet Take 1 tablet (25 mg total) by mouth 2 (two) times daily.   [DISCONTINUED] busPIRone (BUSPAR) 10 MG tablet Take 1 tab by mouth daily   No facility-administered medications prior to visit.    Review of Systems  All other systems reviewed and are negative.  Except see HPI       Objective    There were no vitals taken for this visit.      Physical Exam Constitutional:      General: She is not in acute distress.    Appearance: Normal appearance.  HENT:     Head: Normocephalic.  Pulmonary:     Effort: Pulmonary effort is normal. No respiratory distress.  Neurological:     Mental Status: She is alert and oriented to person, place, and time. Mental status is at baseline.        Assessment & Plan      Anxiety   Improvement noted with Buspirone treatment. Patient reports feeling nervous, anxious, or on edge and trouble relaxing on several days. No difficulty at work or home.   -Continue Buspirone as prescribed.  Migraine   Patient reports frequent but tolerable headaches. Has not  started Topiramate. Discussed the risks and benefits of starting Topiramate vs. managing with Tylenol.   -Consider starting Topiramate if headaches worsen or become intolerable.   -Continue managing with Tylenol as tolerated.  Menstrual Abnormalities   Patient reports heavy menstrual bleeding on the first day of his cycle. Blood work for hormones was normal. Discussed the possibility of endometriosis, polyps, or fibroids and the importance of imaging.   -Refer to OBGYN for imaging and further evaluation.  Renal Function   Slight elevation in creatinine (1.02) and decreased filtration rate (75). No history of kidney disease. Patient reports adequate water intake.   -Increase water intake to 8-12 glasses per day.    -Avoid NSAIDs.   -Repeat CMP in 6 months to monitor renal function.  Follow-up in 6 months to assess anxiety, migraine management, and renal function.      Return in about 6 months (around 07/15/2023) for chronic disease f/u/anxiety and headache.     I discussed the assessment and treatment plan with the patient. The patient was provided an opportunity to ask questions and all were answered. The patient agreed with the plan and demonstrated an understanding of the instructions.   The patient was advised to call back or seek an in-person evaluation if the symptoms worsen or if the condition fails to improve as anticipated.  I provided 15 minutes of non-face-to-face time during this encounter.  I, Rhonda Lat, PA-C have reviewed all documentation for this visit. The documentation on 01/13/21 for the exam, diagnosis, procedures, and orders are all accurate and complete.  Rhonda Lat, [facility], MMS [facility] [phone] (phone) [phone] (fax)  Rhonda Lat, [facility], MMS [facility] [phone] (phone) [phone] (fax)  [facility]

## 2023-01-16 ENCOUNTER — Other Ambulatory Visit: Payer: Self-pay | Admitting: Physician Assistant

## 2023-01-16 DIAGNOSIS — F32A Depression, unspecified: Secondary | ICD-10-CM

## 2023-03-13 ENCOUNTER — Ambulatory Visit: Payer: Managed Care, Other (non HMO) | Admitting: Advanced Practice Midwife

## 2023-03-13 ENCOUNTER — Encounter: Payer: Self-pay | Admitting: Advanced Practice Midwife

## 2023-03-13 VITALS — BP 111/73 | HR 85 | Wt 136.7 lb

## 2023-03-13 DIAGNOSIS — Z01419 Encounter for gynecological examination (general) (routine) without abnormal findings: Secondary | ICD-10-CM | POA: Diagnosis not present

## 2023-03-13 DIAGNOSIS — N9089 Other specified noninflammatory disorders of vulva and perineum: Secondary | ICD-10-CM

## 2023-03-13 NOTE — Patient Instructions (Signed)
Intrauterine Device Information An intrauterine device (IUD) is a medical device that is inserted into the uterus to prevent pregnancy. It is a small, T-shaped device that has one or two nylon strings hanging down from it. The strings hang out of the lower part of the uterus (cervix) to allow for future IUD removal. There are two types of IUDs: Hormone IUD. This type of IUD is made of plastic and contains the hormone progestin (synthetic progesterone). A hormone IUD may last 8 years. Copper IUD. This type of IUD has copper wire wrapped around it. A copper IUD may last up to 10 years. How is an IUD inserted? An IUD is inserted through the vagina, through the cervix, and into the uterus with a minor medical procedure. The procedure for IUD insertion may vary among health care providers and hospitals. How does an IUD work? Synthetic progesterone in a hormonal IUD prevents pregnancy by: Thickening cervical mucus to prevent sperm from entering the uterus. Thinning the uterine lining to prevent a fertilized egg from being implanted there. Copper in a copper IUD prevents pregnancy by making the uterus and fallopian tubes produce a fluid that kills sperm. What are the advantages of an IUD? Advantages of either type of IUD An IUD: Is highly effective in preventing pregnancy. Is reversible. You can become pregnant shortly after the IUD is removed. Is low-maintenance and can stay in place for a long time. Has no estrogen-related side effects. Can be used when breastfeeding. Is not associated with weight gain. Can be inserted right after childbirth, an abortion, or a miscarriage. Advantages of a hormone IUD If it is inserted within 7 days of your period starting, it works right after it has been inserted. If the hormone IUD is inserted at any other time in your cycle, you will need to use a backup method of birth control for 7 days after insertion. It can make menstrual periods lighter or stop  completely. It can reduce menstrual cramping and other discomforts from menstrual periods. It can be used for 3-5 years, depending on which IUD you have. Advantages of a copper IUD It works right after it is inserted. It can be used as a form of emergency birth control if it is inserted within 5 days after having unprotected sex. It does not interfere with your body's natural hormones. It can be used for up to 10 years. What are the disadvantages of an IUD? An IUD may cause irregular menstrual bleeding for a period of time after insertion. It is common to have pain during insertion and have cramping and vaginal bleeding after insertion. An IUD may cut the uterus (uterine perforation) when it is inserted. This is rare. Pelvic inflammatory disease (PID) may happen after insertion of an IUD. PID is an infection in the uterus and fallopian tubes. The IUD does not cause the infection. The infection is usually from an unknown sexually transmitted infection (STI). This is rare, and it usually happens during the first 20 days after the IUD is inserted. A copper IUD can make your menstrual flow heavier and more painful. IUDs cannot prevent sexually transmitted infections (STIs). How is an IUD removed?  You will lie on your back with your knees bent and your feet in footrests (stirrups). A device will be inserted into your vagina to spread apart the vaginal walls (speculum). This will allow your health care provider to see the strings attached to the IUD. Your health care provider will use a small instrument (forceps) to  grasp the IUD strings and will pull firmly until the IUD is removed. You may have some discomfort when the IUD is removed. Your health care provider may recommend taking over-the-counter pain relievers, such as ibuprofen, before the procedure. You may also have minor spotting for a few days after the procedure. The procedure for IUD removal may vary among health care providers and  hospitals. Is an IUD right for me? If you are interested in an IUD, discuss it with your health care provider. He or she will make sure you are a good candidate for an IUD and will let you know more about the advantages, disadvantage, and possible side effects. This will allow you to make a decision about the device. Summary An intrauterine device (IUD) is a medical device that is inserted in the uterus to prevent pregnancy. It is a small, T-shaped device that has one or two nylon strings hanging down from it. A hormone IUD contains the hormone progestin (synthetic progesterone). A copper IUD has copper wire wrapped around it. Synthetic progesterone in a hormone IUD prevents pregnancy by thickening cervical mucus and thinning the walls of the uterus. Copper in a copper IUD prevents pregnancy by making the uterus and fallopian tubes produce a fluid that kills sperm. A hormone IUD can be left in place for 3-5 years. A copper IUD can be left in place for up to 10 years. An IUD is inserted and removed by a health care provider. You may feel some pain during insertion and removal. Your health care provider may recommend taking over-the-counter pain medicine, such as ibuprofen, before an IUD procedure. This information is not intended to replace advice given to you by your health care provider. Make sure you discuss any questions you have with your health care provider. Document Revised: 09/23/2019 Document Reviewed: 09/23/2019 Elsevier Patient Education  2024 [employer] Inc. Skin Tag, Adult A skin tag (acrochordon) is a soft, extra growth of skin. Most skin tags are skin-colored and rarely bigger than a pencil eraser. They often form in areas where there is frequent rubbing, or friction, on the skin. This may be where there are folds in the skin, such as: The eyelids. The neck. The armpits. The groin. Skin tags are not dangerous, and they do not spread from person to person (are not contagious). You may  have one skin tag or many. Skin tags do not need treatment. However, your health care provider may recommend removing a skin tag if it: Gets irritated from clothing or jewelry. Bleeds. Is visible and unsightly. What are the causes? This condition is linked to: Increasing age. Pregnancy. Diabetes. Obesity. What are the signs or symptoms? Skin tags usually do not cause symptoms unless they get irritated by items touching your skin, such as clothing or jewelry. When this happens, you may have pain, itching, or bleeding. How is this diagnosed? This condition is diagnosed with an evaluation from your health care provider. No testing is needed for diagnosis. How is this treated? Treatment for this condition depends on whether you have symptoms. Your health care provider may also remove your skin tag if it is visible or if you do not like the way it looks. A skin tag can be removed by a health care provider with: A simple surgical procedure using scissors. A procedure that involves freezing your skin tag with a gas in liquid form (liquid nitrogen). A procedure that uses heat to destroy your skin tag (electrodessication). Follow these instructions at home: Watch for  any changes in your skin tag. A normal skin tag does not require any other special care at home. Take over-the-counter and prescription medicines only as told by your health care provider. Keep all follow-up visits. Contact a health care provider if: You have a skin tag that: Becomes painful. Changes color. Bleeds. Swells. Summary Skin tags are soft, extra growths of skin found in areas of frequent rubbing or friction. Skin tags usually do not cause symptoms. If symptoms occur, you may have pain, itching, or bleeding. Your health care provider may remove your skin tag if it causes symptoms or if you do not like the way it looks. This information is not intended to replace advice given to you by your health care provider. Make  sure you discuss any questions you have with your health care provider. Document Revised: 04/26/2021 Document Reviewed: 04/26/2021 Elsevier Patient Education  2024 [employer].

## 2023-03-15 ENCOUNTER — Encounter: Payer: Self-pay | Admitting: Advanced Practice Midwife

## 2023-03-15 NOTE — Progress Notes (Signed)
[facility]   Gynecology Annual Exam   PCP: Debera Lat, PA-C  Chief Complaint:  Chief Complaint  Patient presents with   Gynecologic Exam  Date of Service: 03/13/2023  History of Present Illness: Patient is a 32 y.o. [id number] presents for annual exam. The patient has complaint today of premenstrual symptoms of moderate discomfort including; cramps, headache, fatigue, nausea, mood changes. The first day of her periods are heavy. She is interested in having Mirena IUD. We discussed calling for appointment with MD (she is also interested in having labial skin tag removed) on first day of next period.    LMP: Patient's last menstrual period was 03/05/2023 (exact date). Average Interval: regular, 28 days Duration of flow:  4-5  days Heavy Menses: yes Clots: no Intermenstrual Bleeding: no Postcoital Bleeding: no Dysmenorrhea: yes  The patient is sexually active. She currently uses condoms for contraception. She denies dyspareunia.  The patient does perform self breast exams.  There is no notable family history of breast or ovarian cancer in her family.  The patient wears seatbelts: yes.   The patient has regular exercise: she goes to the gym 3 times per month and is active with her children, she admits healthy lifestyle; diet, hydration, sleep.    The patient denies current symptoms of depression.  She takes Buspar with good results.  Review of Systems: Review of Systems  Constitutional:  Positive for malaise/fatigue. Negative for chills and fever.  HENT:  Negative for congestion, ear discharge, ear pain, hearing loss, sinus pain and sore throat.   Eyes:  Negative for blurred vision and double vision.  Respiratory:  Negative for cough, shortness of breath and wheezing.   Cardiovascular:  Negative for chest pain, palpitations and leg swelling.  Gastrointestinal:  Positive for abdominal pain and nausea. Negative for blood in stool, constipation, diarrhea, heartburn, melena and  vomiting.  Genitourinary:  Negative for dysuria, flank pain, frequency, hematuria and urgency.  Musculoskeletal:  Negative for back pain, joint pain and myalgias.  Skin:  Negative for itching and rash.  Neurological:  Positive for headaches. Negative for dizziness, tingling, tremors, sensory change, speech change, focal weakness, seizures, loss of consciousness and weakness.  Endo/Heme/Allergies:  Negative for environmental allergies. Does not bruise/bleed easily.  Psychiatric/Behavioral:  Negative for depression, hallucinations, memory loss, substance abuse and suicidal ideas. The patient is not nervous/anxious and does not have insomnia.        Positive for mood changes with period    Past Medical History:  Patient Active Problem List   Diagnosis Date Noted Date Diagnosed   Nonintractable headache 12/04/2022    Other fatigue 12/04/2022    Anxiety and depression 04/04/2018     Past Surgical History:  Past Surgical History:  Procedure Laterality Date   NO PAST SURGERIES      Gynecologic History:  Patient's last menstrual period was 03/05/2023 (exact date). Contraception: condoms Last Pap: 2022 Results were: no abnormalities   Obstetric History: G2P2002  Family History:  Family History  Problem Relation Age of Onset   Diabetes Maternal Grandmother    Breast cancer Maternal Grandmother 30   Uterine cancer Mother 12   Cancer Neg Hx     Social History:  Social History   Socioeconomic History   Marital status: Single    Spouse name: Not on file   Number of children: Not on file   Years of education: Not on file   Highest education level: Bachelor's degree (e.g., BA, AB, BS)  Occupational  History   Not on file  Tobacco Use   Smoking status: Former   Smokeless tobacco: Current  Substance and Sexual Activity   Alcohol use: Yes    Alcohol/week: 3.0 - 4.0 standard drinks of alcohol    Types: 3 - 4 Glasses of wine per week   Drug use: No   Sexual activity: Yes     Birth control/protection: None  Other Topics Concern   Not on file  Social History Narrative   Not on file   Social Drivers of Health   Financial Resource Strain: Low Risk  (04/18/2021)   Overall Financial Resource Strain (CARDIA)    Difficulty of Paying Living Expenses: Not hard at all  Food Insecurity: No Food Insecurity (04/18/2021)   Hunger Vital Sign    Worried About Running Out of Food in the Last Year: Never true    Ran Out of Food in the Last Year: Never true  Transportation Needs: No Transportation Needs (04/18/2021)   PRAPARE - Administrator, Civil Service (Medical): No    Lack of Transportation (Non-Medical): No  Physical Activity: Unknown (04/18/2021)   Exercise Vital Sign    Days of Exercise per Week: 0 days    Minutes of Exercise per Session: Not on file  Stress: Stress Concern Present (04/18/2021)   [employer] of Occupational Health - Occupational Stress Questionnaire    Feeling of Stress : Rather much  Social Connections: Unknown (08/08/2021)   Received from [facility]   Social Network    Social Network: Not on file  Intimate Partner Violence: Unknown (06/30/2021)   Received from [facility], [facility]   HITS    Physically Hurt: Not on file    Insult or Talk Down To: Not on file    Threaten Physical Harm: Not on file    Scream or Curse: Not on file    Allergies:  No Known Allergies  Medications: Prior to Admission medications   Medication Sig Start Date End Date Taking? Authorizing Provider  busPIRone (BUSPAR) 10 MG tablet Take 1 tab by mouth daily 01/14/23  Yes Ostwalt, Edmon Crape, PA-C    Physical Exam Vitals: Blood pressure 111/73, pulse 85, weight 136 lb 11.2 oz (62 kg), last menstrual period 03/05/2023  General: NAD HEENT: normocephalic, anicteric Thyroid: no enlargement, no palpable nodules Pulmonary: No increased work of breathing, CTAB Cardiovascular: RRR, distal pulses 2+ Breast: Breast symmetrical, no  tenderness, no palpable nodules or masses, no skin or nipple retraction present, no nipple discharge.  No axillary or supraclavicular lymphadenopathy. Abdomen: NABS, soft, non-tender, non-distended.  Umbilicus without lesions.  No hepatomegaly, splenomegaly or masses palpable. No evidence of hernia  Genitourinary:  External: small skin tag of upper labia majora, non tender to palpation   Vagina: Normal vaginal mucosa, no evidence of prolapse.    Cervix: Grossly normal in appearance, no bleeding  Uterus: Non-enlarged, mobile, normal contour.  No CMT  Adnexa: ovaries non-enlarged, no adnexal masses  Rectal: deferred  Lymphatic: no evidence of inguinal lymphadenopathy Extremities: no edema, erythema, or tenderness Neurologic: Grossly intact Psychiatric: mood appropriate, affect full   Assessment: 32 y.o. G2P2002 routine annual exam  Plan: Problem List Items Addressed This Visit   None Visit Diagnoses       Well woman exam with routine gynecological exam    -  Primary     Skin tag of labia           1) STI screening  was offered  and declined  2)  ASCCP guidelines and rationale discussed.  Patient opts for every 3 years screening interval. PAP due next year  3) Contraception - the patient is currently using  condoms.  She is  possibly interested in switching to Mirena IUD for cycle/birth control  4) Routine healthcare maintenance including cholesterol, diabetes screening discussed managed by PCP  5) Return in about 2 weeks (around 03/27/2023) for call when on next period for IUD/Mirena and evaluation/ligation of skin tag.   Tresea Mall, CNM [facility] 03/15/2023 1:30 PM

## 2023-07-20 ENCOUNTER — Other Ambulatory Visit: Payer: Self-pay | Admitting: Physician Assistant

## 2023-07-20 DIAGNOSIS — F32A Depression, unspecified: Secondary | ICD-10-CM

## 2023-07-22 NOTE — Telephone Encounter (Signed)
 30 day courtesy refill (called pt and LM on VM to call back to make appt)  Requested Prescriptions  Pending Prescriptions Disp Refills   busPIRone  (BUSPAR ) 10 MG tablet [Pharmacy Med Name: BUSPIRONE  HCL 10 MG TABLET] 30 tablet 0    Sig: TAKE 1 TABLET BY MOUTH EVERY DAY     Psychiatry: Anxiolytics/Hypnotics - Non-controlled Failed - 07/22/2023 10:57 AM      Failed - Valid encounter within last 12 months    Recent Outpatient Visits   None

## 2023-08-16 ENCOUNTER — Other Ambulatory Visit: Payer: Self-pay | Admitting: Physician Assistant

## 2023-08-16 DIAGNOSIS — F32A Depression, unspecified: Secondary | ICD-10-CM

## 2024-02-15 ENCOUNTER — Other Ambulatory Visit: Payer: Self-pay | Admitting: Physician Assistant

## 2024-02-15 DIAGNOSIS — F32A Depression, unspecified: Secondary | ICD-10-CM

## 2024-02-17 ENCOUNTER — Other Ambulatory Visit: Payer: Self-pay | Admitting: Physician Assistant

## 2024-02-17 DIAGNOSIS — F419 Anxiety disorder, unspecified: Secondary | ICD-10-CM

## 2024-02-17 NOTE — Telephone Encounter (Unsigned)
 Copied from CRM [id number]. Topic: Clinical - Medication Refill >> Feb 17, 2024 10:52 AM Amy B wrote: Medication: busPIRone  (BUSPAR ) 10 MG tablet   Has the patient contacted their pharmacy? Yes (Agent: If no, request that the patient contact the pharmacy for the refill. If patient does not wish to contact the pharmacy document the reason why and proceed with request.) (Agent: If yes, when and what did the pharmacy advise?)  This is the patient's preferred pharmacy:   [id number] - [place], [place] - [address] [facility] [address] Phone: [phone] Fax: [phone]  Is this the correct pharmacy for this prescription? Yes If no, delete pharmacy and type the correct one.   Has the prescription been filled recently? No  Is the patient out of the medication? No  Has the patient been seen for an appointment in the last year OR does the patient have an upcoming appointment? Yes  Can we respond through MyChart? Yes  Agent: Please be advised that Rx refills may take up to 3 business days. We ask that you follow-up with your pharmacy.

## 2024-02-18 ENCOUNTER — Other Ambulatory Visit: Payer: Self-pay | Admitting: Physician Assistant

## 2024-02-18 DIAGNOSIS — F419 Anxiety disorder, unspecified: Secondary | ICD-10-CM

## 2024-02-18 NOTE — Telephone Encounter (Signed)
 Unable to refill per protocol, appointment needed.   Requested Prescriptions  Pending Prescriptions Disp Refills   busPIRone  (BUSPAR ) 10 MG tablet 90 tablet 1    Sig: Take 1 tablet (10 mg total) by mouth daily.     Psychiatry: Anxiolytics/Hypnotics - Non-controlled Failed - 02/18/2024  2:45 PM      Failed - Valid encounter within last 12 months    Recent Outpatient Visits   None

## 2024-03-12 ENCOUNTER — Other Ambulatory Visit: Payer: Self-pay | Admitting: Physician Assistant

## 2024-03-12 DIAGNOSIS — F419 Anxiety disorder, unspecified: Secondary | ICD-10-CM

## 2024-03-13 NOTE — Telephone Encounter (Signed)
 Copied from CRM #[id number]. Topic: Clinical - Medication Question >> Mar 13, 2024  9:30 AM Gustabo D wrote: Pt has 10 left of her prescription- busPIRone  (BUSPAR ) 10 MG tablet  She says she can not wait until May 04, 2024. She wants to know if it can be filled before then. >> Mar 13, 2024  9:31 AM Gustabo D wrote: Pt can be reached on mychart

## 2024-03-14 ENCOUNTER — Other Ambulatory Visit: Payer: Self-pay | Admitting: Physician Assistant

## 2024-03-14 DIAGNOSIS — F419 Anxiety disorder, unspecified: Secondary | ICD-10-CM

## 2024-03-16 ENCOUNTER — Ambulatory Visit: Admitting: Family Medicine

## 2024-03-16 VITALS — BP 98/65 | HR 77 | Ht 63.0 in | Wt 142.1 lb

## 2024-03-16 DIAGNOSIS — F411 Generalized anxiety disorder: Secondary | ICD-10-CM | POA: Diagnosis not present

## 2024-03-16 MED ORDER — BUSPIRONE HCL 10 MG PO TABS: 10.0000 mg | ORAL_TABLET | Freq: Every day | ORAL | 1 refills | Status: AC

## 2024-03-16 NOTE — Patient Instructions (Addendum)
 Please review the attached list of medications and notify my office if there are any errors.   Please contact [facility] at [phone]  to schedule your routine gyn exam and pap screening.   I recommend that you get the Hepatitis B vaccine. You can get this vaccine at most pharmacies, or schedule an appointment to get it at [facility].

## 2024-03-16 NOTE — Progress Notes (Signed)
" °  ° ° °  Established patient visit   Patient: Rhonda Sweeney   DOB: 08-20-1990   33 y.o. Female  MRN: [id number] Visit Date: 03/16/2024  Today's healthcare provider: Nancyann Perry, MD   Chief Complaint  Patient presents with   Medication Refill   Anxiety and Depression    Patient reports taking medication as prescribed and tolerating well. She reports her anxiety and depression is mild and unchanged since her last visit.    Subjective    Discussed the use of AI scribe software for clinical note transcription with the patient, who gave verbal consent to proceed.  History of Present Illness   Rhonda Sweeney is a 33 year old female with anxiety who presents for a medication refill.  She is here for a follow-up visit to refill her medication, buspirone , which she takes for anxiety. She takes 10 mg once daily at night and has been on this medication for a long time, although she briefly discontinued it either last year or the year before to assess her need for it. She realized she still needed it and resumed taking it.  She has not experienced panic attacks in a few years. She works in lexicographer, and she stated that this work can be a source of anxiety. No side effects from buspirone  and her sleep is good.  She has a history of depression, which she states is no longer an issue. She does not take buspirone  as an antidepressant.       Medications: Show/hide medication list[1] Review of Systems  Constitutional:  Negative for appetite change, chills, fatigue and fever.  Respiratory:  Negative for chest tightness and shortness of breath.   Cardiovascular:  Negative for chest pain and palpitations.  Gastrointestinal:  Negative for abdominal pain, nausea and vomiting.  Neurological:  Negative for dizziness and weakness.       Objective    BP 98/65 (BP Location: Left Arm, Patient Position: Sitting, Cuff Size: Normal)   Pulse 77   Ht 5' 3 (1.6 m)   Wt 142 lb 1.6  oz (64.5 kg)   LMP 02/24/2024   SpO2 99%   Breastfeeding No   BMI 25.17 kg/m   Physical Exam   General appearance: Well developed, well nourished female, cooperative and in no acute distress Head: Normocephalic, without obvious abnormality, atraumatic Respiratory: Respirations even and unlabored, normal respiratory rate Extremities: All extremities are intact.  Skin: Skin color, texture, turgor normal. No rashes seen  Psych: Appropriate mood and affect. Neurologic: Mental status: Alert, oriented to person, place, and time, thought content appropriate.   Assessment & Plan    1. Generalized anxiety disorder (Primary) Doing very well on current buspirone  regiment.  - busPIRone  (BUSPAR ) 10 MG tablet; Take 1 tablet (10 mg total) by mouth daily.  Dispense: 90 tablet; Refill: 1  Recommended Hepatitis B vaccine series Advised to follow up gyn for routine pap/pelvic Follow up Janna Ostwalt 6 months.        Nancyann Perry, MD  [facility] [phone] (phone) [phone] (fax)  [facility]    [1]  Outpatient Medications Prior to Visit  Medication Sig   [DISCONTINUED] busPIRone  (BUSPAR ) 10 MG tablet TAKE 1 TABLET BY MOUTH EVERY DAY   No facility-administered medications prior to visit.   "

## 2024-05-04 ENCOUNTER — Ambulatory Visit: Admitting: Physician Assistant

## 2024-09-15 ENCOUNTER — Ambulatory Visit: Admitting: Physician Assistant
